# Patient Record
Sex: Female | Born: 1990 | Race: Black or African American | Hispanic: No | Marital: Single | Smoking: Never smoker
Health system: Southern US, Community
[De-identification: ages and names within clinical notes are randomized; demographics above are authoritative.]

## PROBLEM LIST (undated history)

## (undated) ENCOUNTER — Inpatient Hospital Stay (HOSPITAL_COMMUNITY): Payer: Self-pay

## (undated) DIAGNOSIS — Z789 Other specified health status: Secondary | ICD-10-CM

## (undated) HISTORY — PX: APPENDECTOMY: SHX54

---

## 2011-04-06 ENCOUNTER — Inpatient Hospital Stay (HOSPITAL_COMMUNITY): Admission: AD | Admit: 2011-04-06 | Payer: Self-pay | Source: Home / Self Care | Admitting: Obstetrics and Gynecology

## 2015-09-26 ENCOUNTER — Encounter (HOSPITAL_COMMUNITY): Payer: Self-pay | Admitting: *Deleted

## 2015-09-26 ENCOUNTER — Inpatient Hospital Stay (HOSPITAL_COMMUNITY)
Admission: AD | Admit: 2015-09-26 | Discharge: 2015-09-26 | Disposition: A | Discharge status: Home / Self Care | Payer: BLUE CROSS/BLUE SHIELD | Source: Ambulatory Visit | Attending: Obstetrics and Gynecology | Admitting: Obstetrics and Gynecology

## 2015-09-26 DIAGNOSIS — O3680X0 Pregnancy with inconclusive fetal viability, not applicable or unspecified: Secondary | ICD-10-CM

## 2015-09-26 DIAGNOSIS — O26891 Other specified pregnancy related conditions, first trimester: Secondary | ICD-10-CM | POA: Insufficient documentation

## 2015-09-26 DIAGNOSIS — Z3A01 Less than 8 weeks gestation of pregnancy: Secondary | ICD-10-CM | POA: Insufficient documentation

## 2015-09-26 DIAGNOSIS — R109 Unspecified abdominal pain: Secondary | ICD-10-CM | POA: Insufficient documentation

## 2015-09-26 DIAGNOSIS — O9989 Other specified diseases and conditions complicating pregnancy, childbirth and the puerperium: Secondary | ICD-10-CM

## 2015-09-26 DIAGNOSIS — O26899 Other specified pregnancy related conditions, unspecified trimester: Secondary | ICD-10-CM

## 2015-09-26 HISTORY — DX: Other specified health status: Z78.9

## 2015-09-26 LAB — CBC
HEMATOCRIT: 36.8 % (ref 36.0–46.0)
HEMOGLOBIN: 11.6 g/dL — AB (ref 12.0–15.0)
MCH: 27.5 pg (ref 26.0–34.0)
MCHC: 31.5 g/dL (ref 30.0–36.0)
MCV: 87.2 fL (ref 78.0–100.0)
Platelets: 229 10*3/uL (ref 150–400)
RBC: 4.22 MIL/uL (ref 3.87–5.11)
RDW: 12.5 % (ref 11.5–15.5)
WBC: 8.7 10*3/uL (ref 4.0–10.5)

## 2015-09-26 LAB — POCT PREGNANCY, URINE: Preg Test, Ur: POSITIVE — AB

## 2015-09-26 LAB — URINE MICROSCOPIC-ADD ON

## 2015-09-26 LAB — URINALYSIS, ROUTINE W REFLEX MICROSCOPIC
BILIRUBIN URINE: NEGATIVE
GLUCOSE, UA: NEGATIVE mg/dL
KETONES UR: 15 mg/dL — AB
LEUKOCYTES UA: NEGATIVE
NITRITE: NEGATIVE
PH: 6 (ref 5.0–8.0)
PROTEIN: NEGATIVE mg/dL
Specific Gravity, Urine: 1.02 (ref 1.005–1.030)
Urobilinogen, UA: 2 mg/dL — ABNORMAL HIGH (ref 0.0–1.0)

## 2015-09-26 LAB — HCG, QUANTITATIVE, PREGNANCY: HCG, BETA CHAIN, QUANT, S: 636 m[IU]/mL — AB (ref <5)

## 2015-09-26 NOTE — MAU Note (Signed)
Pt stated she started having lower abd pain and back pain today. Had a positive HPT.

## 2015-09-26 NOTE — MAU Provider Note (Signed)
History     CSN: [id number]  Arrival date and time: 09/26/15 1718   None     Chief Complaint  Patient presents with  . Abdominal Pain  . Back Pain   HPI   Theresa Hull is a 24 y.o. female G1P0 @ 1649w1d presenting for an US.  She took an at home pregnancy test today and it was positive. The Abdominal pain started today shortly after the test was positive; bilateral lower abdomen, + cramping pain. Currently rates her pain 5/10; the pain comes and goes.   Denies vaginal bleeding.   OB History    Gravida Para Term Preterm AB TAB SAB Ectopic Multiple Living   1               No past medical history on file.  No past surgical history on file.  No family history on file.  Social History  Substance Use Topics  . Smoking status: Not on file  . Smokeless tobacco: Not on file  . Alcohol Use: Not on file    Allergies: Allergies not on file  No prescriptions prior to admission   Results for orders placed or performed during the hospital encounter of 09/26/15 (from the past 48 hour(s))  Urinalysis, Routine w reflex microscopic ([facility])     Status: Abnormal   Collection Time: 09/26/15  5:40 PM  Result Value Ref Range   Color, Urine YELLOW YELLOW   APPearance CLEAR CLEAR   Specific Gravity, Urine 1.020 1.005 - 1.030   pH 6.0 5.0 - 8.0   Glucose, UA NEGATIVE NEGATIVE mg/dL   Hgb urine dipstick TRACE (A) NEGATIVE   Bilirubin Urine NEGATIVE NEGATIVE   Ketones, ur 15 (A) NEGATIVE mg/dL   Protein, ur NEGATIVE NEGATIVE mg/dL   Urobilinogen, UA 2.0 (H) 0.0 - 1.0 mg/dL   Nitrite NEGATIVE NEGATIVE   Leukocytes, UA NEGATIVE NEGATIVE  Urine microscopic-add on     Status: None   Collection Time: 09/26/15  5:40 PM  Result Value Ref Range   Squamous Epithelial / LPF RARE RARE   WBC, UA 0-2 <3 WBC/hpf   RBC / HPF 0-2 <3 RBC/hpf   Bacteria, UA RARE RARE  Pregnancy, urine POC     Status: Abnormal   Collection Time: 09/26/15  5:46 PM  Result Value Ref Range   Preg  Test, Ur POSITIVE (A) NEGATIVE    Comment:        THE SENSITIVITY OF THIS METHODOLOGY IS >24 mIU/mL   hCG, quantitative, pregnancy     Status: Abnormal   Collection Time: 09/26/15  7:01 PM  Result Value Ref Range   hCG, Beta Chain, Quant, S 636 (H) <5 mIU/mL    Comment:          GEST. AGE      CONC.  (mIU/mL)   <=1 WEEK        5 - 50     2 WEEKS       50 - 500     3 WEEKS       100 - 10,000     4 WEEKS     1,000 - 30,000     5 WEEKS     3,500 - 115,000   6-8 WEEKS     12,000 - 270,000    12 WEEKS     15,000 - 220,000        FEMALE AND NON-PREGNANT FEMALE:     LESS THAN 5 mIU/mL  CBC     Status: Abnormal   Collection Time: 09/26/15  7:01 PM  Result Value Ref Range   WBC 8.7 4.0 - 10.5 K/uL   RBC 4.22 3.87 - 5.11 MIL/uL   Hemoglobin 11.6 (L) 12.0 - 15.0 g/dL   HCT 16.1 09.6 - 04.5 %   MCV 87.2 78.0 - 100.0 fL   MCH 27.5 26.0 - 34.0 pg   MCHC 31.5 30.0 - 36.0 g/dL   RDW 40.9 81.1 - 91.4 %   Platelets 229 150 - 400 K/uL     Review of Systems  Constitutional: Negative for fever and chills.  Gastrointestinal: Positive for abdominal pain. Negative for nausea and vomiting.  Neurological: Negative for headaches.   Physical Exam   Blood pressure 128/83, pulse 106, temperature 98.8 F (37.1 C), temperature source Oral, resp. rate 18, height  (1.626 m), weight 207 lb 6.4 oz (94.076 kg), last menstrual period 08/20/2015.  Physical Exam  Constitutional: She is oriented to person, place, and time. She appears well-developed and well-nourished.  Non-toxic appearance. She does not have a sickly appearance. She does not appear ill. No distress.  HENT:  Head: Normocephalic.  Eyes: Pupils are equal, round, and reactive to light.  Neck: Neck supple.  Respiratory: Effort normal.  GI: Soft. She exhibits no distension. There is no tenderness. There is no rebound.  Musculoskeletal: Normal range of motion.  Neurological: She is alert and oriented to person, place, and time.  Skin:  Skin is warm. She is not diaphoretic.  Psychiatric: Her behavior is normal.    MAU Course  Procedures  None  MDM  Discussed patient with Dr. Marcelle Overlie. No [place] needed at this time> will ask patient to return in 48 hours for quant CBC   Assessment and Plan   A:  1. Abdominal pain in pregnancy   2. Pregnancy of unknown anatomic location    P:  Discharge home in stable condition Follow up in MAU in 48 hours for repeat beta hcg level Pelvic rest Ectopic precautions Return to MAU if symptoms worsen   Duane Lope, NP 09/26/2015 10:19 PM

## 2015-09-26 NOTE — MAU Note (Signed)
J Rasch NP in Triage to talk with pt lab results and d/c plan. Will return in 48hrs for labs. D/c from Triage

## 2015-10-23 LAB — OB RESULTS CONSOLE ABO/RH: RH Type: POSITIVE

## 2015-10-23 LAB — OB RESULTS CONSOLE ANTIBODY SCREEN: ANTIBODY SCREEN: NEGATIVE

## 2015-10-23 LAB — OB RESULTS CONSOLE RPR: RPR: NONREACTIVE

## 2015-10-23 LAB — OB RESULTS CONSOLE RUBELLA ANTIBODY, IGM: Rubella: IMMUNE

## 2015-10-23 LAB — OB RESULTS CONSOLE HEPATITIS B SURFACE ANTIGEN: Hepatitis B Surface Ag: NEGATIVE

## 2015-10-23 LAB — OB RESULTS CONSOLE HIV ANTIBODY (ROUTINE TESTING): HIV: NONREACTIVE

## 2015-11-10 LAB — OB RESULTS CONSOLE GC/CHLAMYDIA
CHLAMYDIA, DNA PROBE: NEGATIVE
GC PROBE AMP, GENITAL: NEGATIVE

## 2016-05-09 LAB — OB RESULTS CONSOLE GBS: GBS: POSITIVE

## 2016-05-16 ENCOUNTER — Inpatient Hospital Stay (HOSPITAL_COMMUNITY)
Admission: AD | Admit: 2016-05-16 | Discharge: 2016-05-16 | Disposition: A | Discharge status: Home / Self Care | Payer: BLUE CROSS/BLUE SHIELD | Source: Ambulatory Visit | Attending: Obstetrics and Gynecology | Admitting: Obstetrics and Gynecology

## 2016-05-16 ENCOUNTER — Encounter (HOSPITAL_COMMUNITY): Payer: Self-pay

## 2016-05-16 DIAGNOSIS — Z3493 Encounter for supervision of normal pregnancy, unspecified, third trimester: Secondary | ICD-10-CM | POA: Insufficient documentation

## 2016-05-16 MED ORDER — LACTATED RINGERS IV BOLUS (SEPSIS)
1000.0000 mL | Freq: Once | INTRAVENOUS | Status: AC
  Administered 2016-05-16: 1000 mL via INTRAVENOUS

## 2016-05-16 NOTE — MAU Note (Signed)
Pt c/o feeling contractions since Wednesday. The contractions vary in timing between 5 to 20 minutes apart. Pt denies bleeding and leaking of fluid. Pt states baby has been moving normally.

## 2016-05-21 ENCOUNTER — Encounter (HOSPITAL_COMMUNITY): Payer: Self-pay | Admitting: *Deleted

## 2016-05-21 ENCOUNTER — Inpatient Hospital Stay (HOSPITAL_COMMUNITY): Payer: BLUE CROSS/BLUE SHIELD

## 2016-05-21 ENCOUNTER — Inpatient Hospital Stay (HOSPITAL_COMMUNITY)
Admission: AD | Admit: 2016-05-21 | Discharge: 2016-05-22 | Disposition: A | Discharge status: Home / Self Care | Payer: BLUE CROSS/BLUE SHIELD | Source: Ambulatory Visit | Attending: Obstetrics & Gynecology | Admitting: Obstetrics & Gynecology

## 2016-05-21 DIAGNOSIS — Z3A38 38 weeks gestation of pregnancy: Secondary | ICD-10-CM | POA: Diagnosis not present

## 2016-05-21 DIAGNOSIS — IMO0002 Reserved for concepts with insufficient information to code with codable children: Secondary | ICD-10-CM

## 2016-05-21 DIAGNOSIS — O283 Abnormal ultrasonic finding on antenatal screening of mother: Secondary | ICD-10-CM | POA: Diagnosis not present

## 2016-05-21 DIAGNOSIS — O368390 Maternal care for abnormalities of the fetal heart rate or rhythm, unspecified trimester, not applicable or unspecified: Secondary | ICD-10-CM

## 2016-05-21 IMAGING — US US MFM FETAL BPP W/O NON-STRESS
1 series · 13 of 15 positions shown · non-contrast
Comparison: none

[Series 1: us mfm fetal bpp w/o non-stress · 15 acquisitions, 13 frames shown]
[im 1/15]
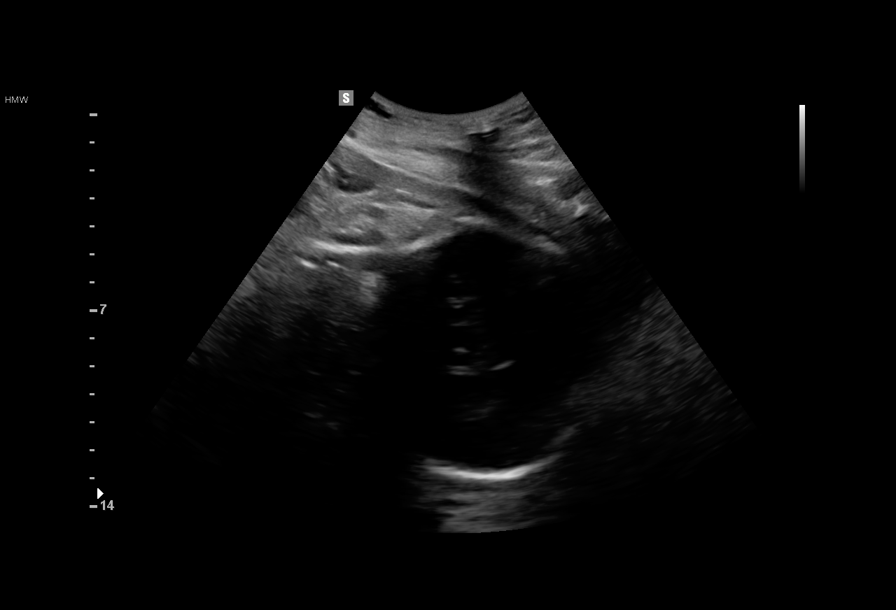
[im 2/15]
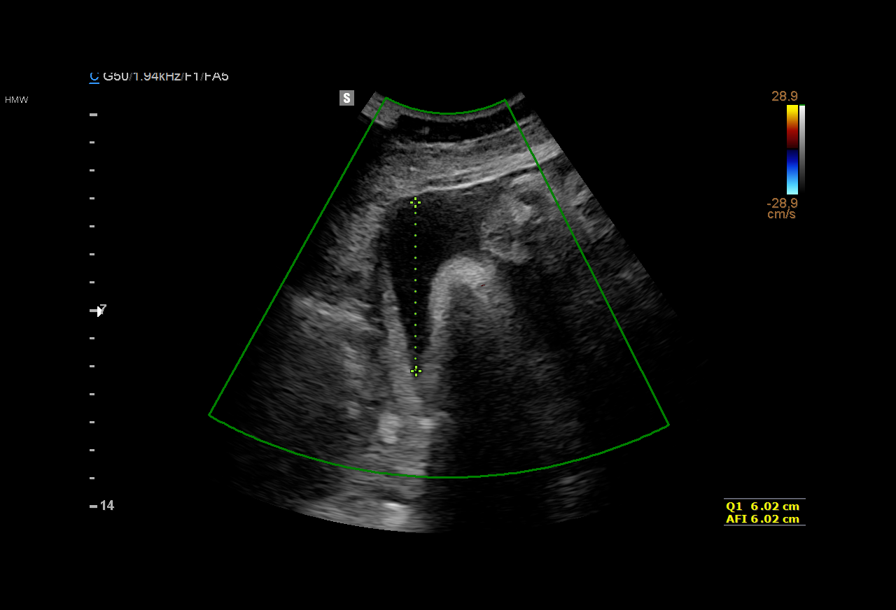
[im 3/15]
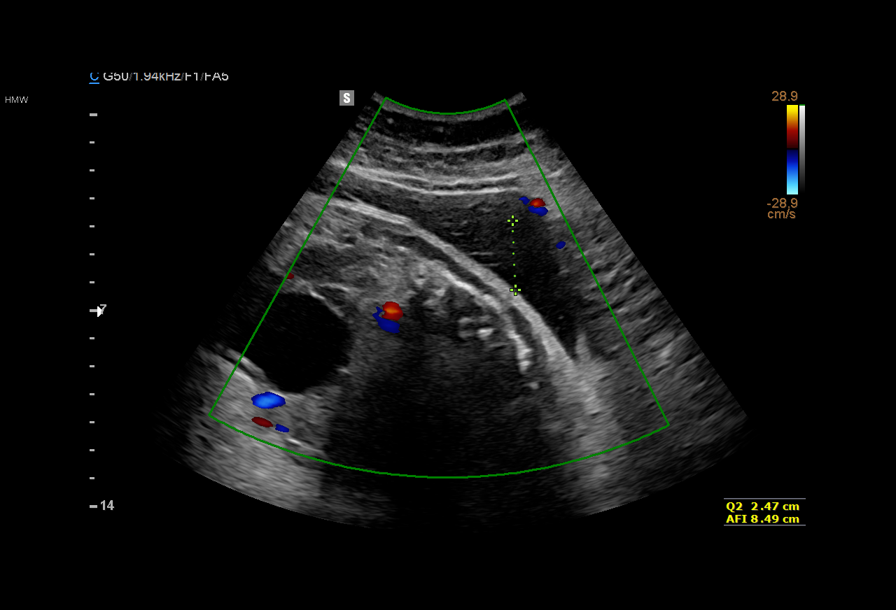
[im 5/15]
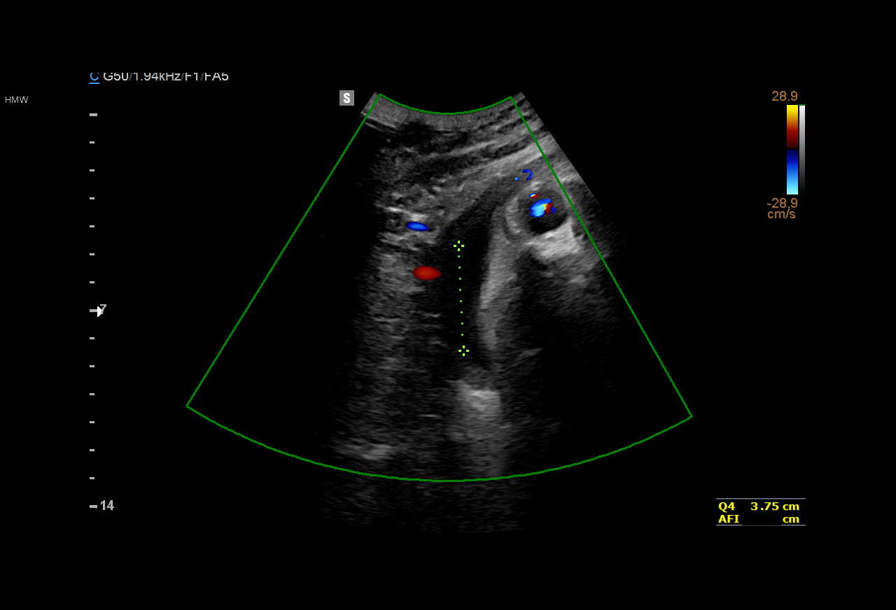
[im 6/15]
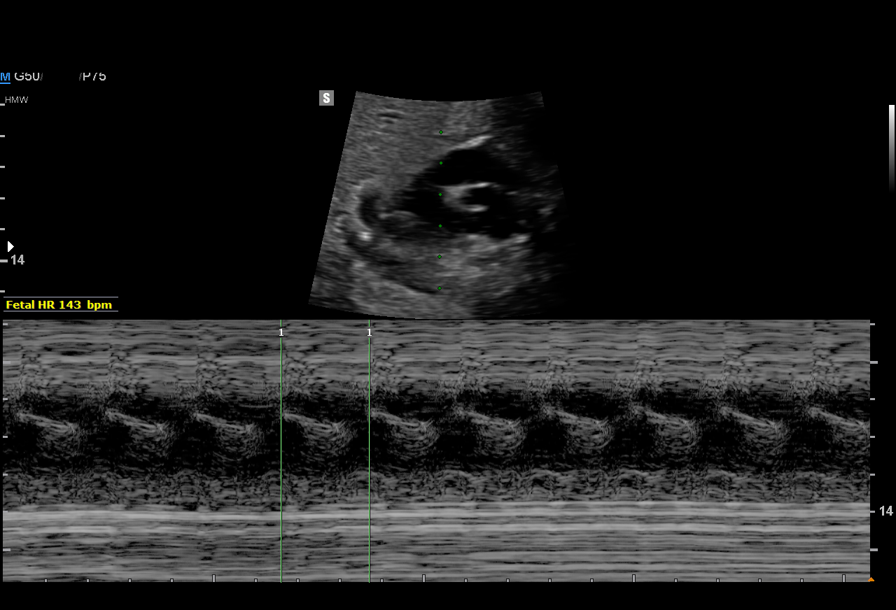
[im 7/15]
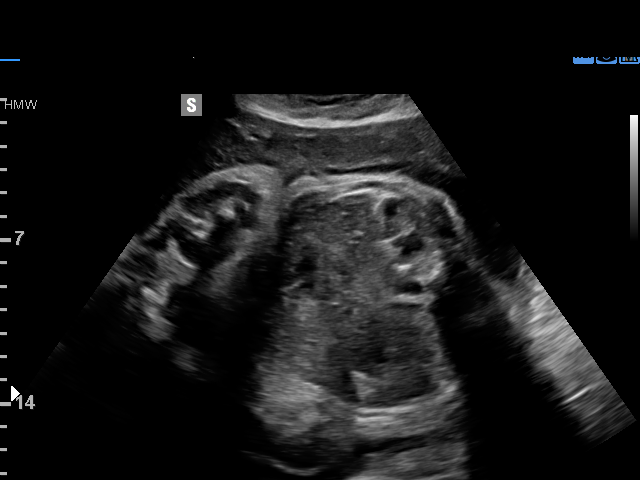
[im 8/15]
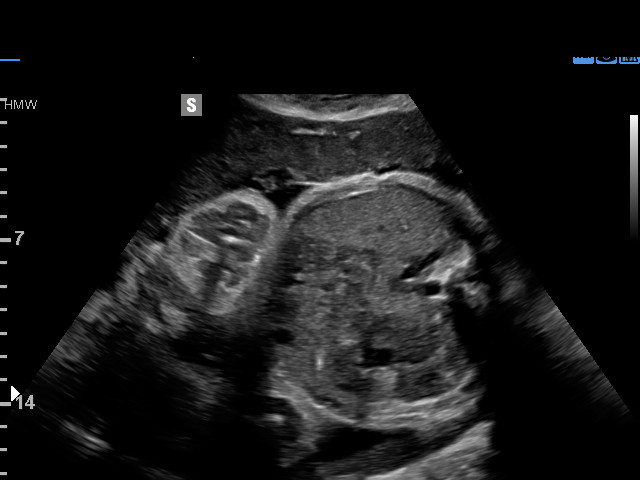
[im 9/15]
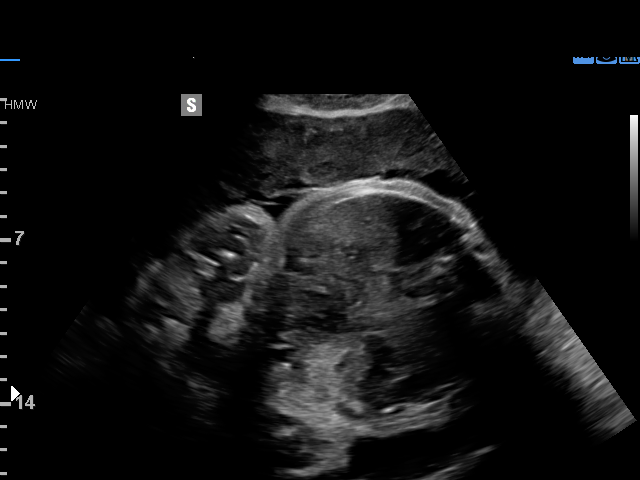
[im 10/15]
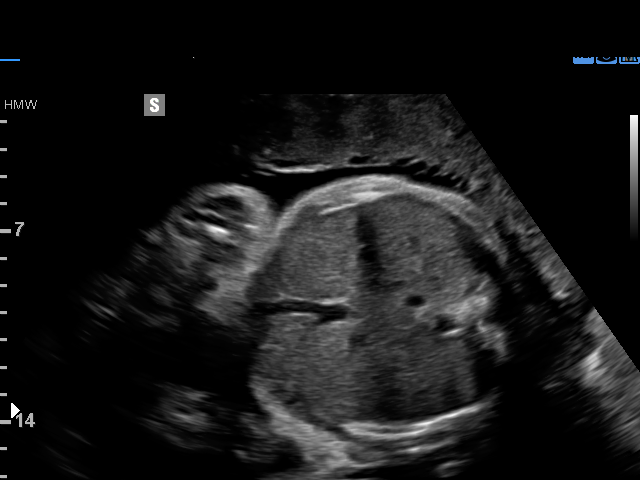
[im 11/15]
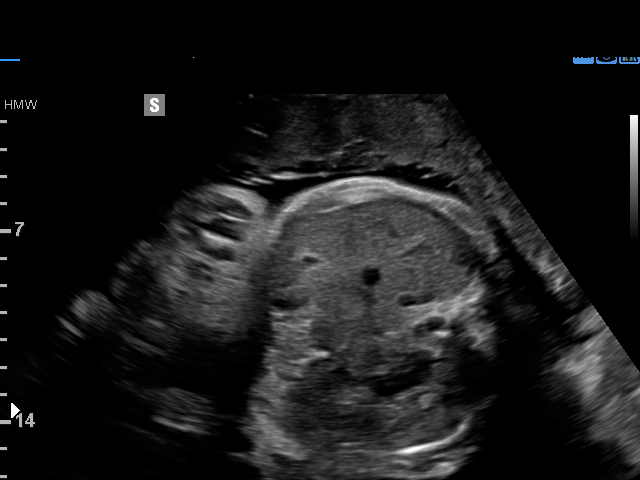
[im 13/15]
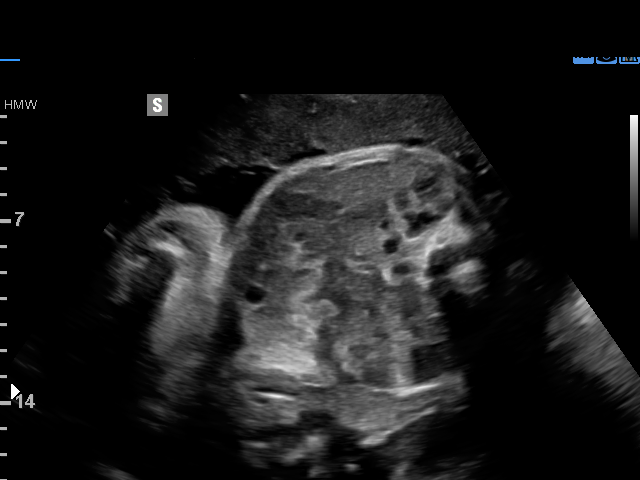
[im 14/15]
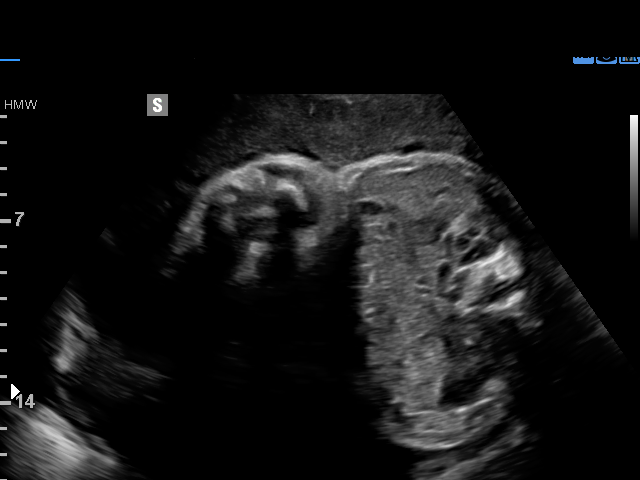
[im 15/15]
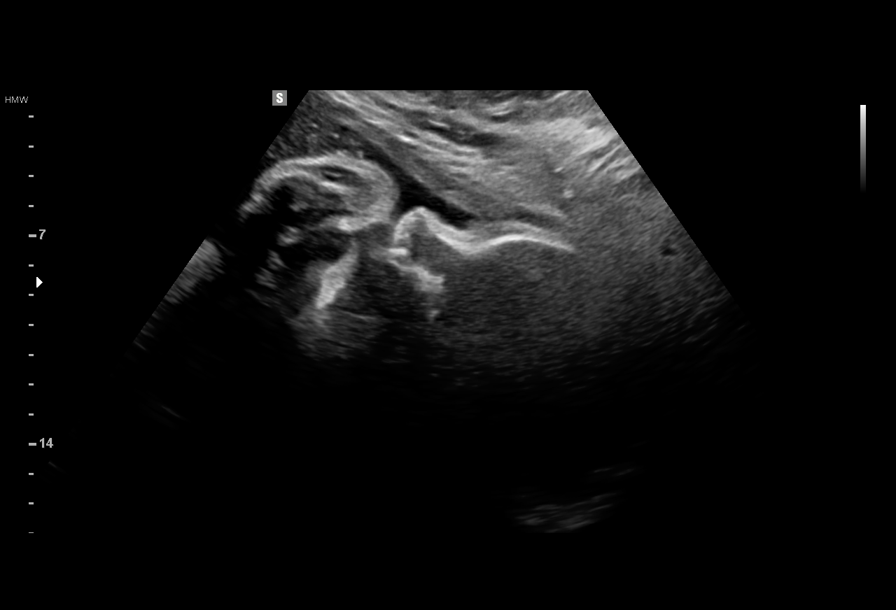

[13 of 15 positions shown; findings below may reference images not displayed]

[REDACTED]. [HOSPITAL]
Attending:        TURGEON        Secondary Phy.:    TURGEON Nursing-
MAU/Triage
DO

1  TURGEON             [PHONE_NUMBER]      [PHONE_NUMBER]     [PHONE_NUMBER]
Indications

38 weeks gestation of pregnancy
Non-reactive NST, FHR decelerations            [MW]
OB History

Gravidity:    2         Term:   1
Living:       1
Fetal Evaluation

Num Of Fetuses:     1
Fetal Heart         143
Rate(bpm):
Cardiac Activity:   Observed
Presentation:       Cephalic

Amniotic Fluid
AFI FV:      Subjectively within normal limits

AFI Sum(cm)     %Tile       Largest Pocket(cm)
15.37           60

RUQ(cm)       RLQ(cm)       LUQ(cm)        LLQ(cm)
6.02
Biophysical Evaluation

Amniotic F.V:   Within normal limits       F. Tone:         Observed
F. Movement:    Observed                   Score:           [DATE]
F. Breathing:   Observed
Gestational Age
LMP:           38w 1d        Date:  [DATE]                 EDD:   [DATE]
Best:          38w 1d     Det. By:  LMP  ([DATE])          EDD:   [DATE]
Cervix Uterus Adnexa

Cervix
Not visualized (advanced GA >[MW])
Impression

Single IUP at 38w 1d
BPP [DATE]
Cephalic presentation
Normal amniotic fluid volume
Recommendations

Follow up ultrasound as clinically indicated

## 2016-05-21 NOTE — MAU Note (Signed)
Patient has been having irregular contractions for past few days and they are now 15-7225min apart.  Denies LOF or vaginal bleeding.  Reports +fetal movement.

## 2016-05-22 DIAGNOSIS — O283 Abnormal ultrasonic finding on antenatal screening of mother: Secondary | ICD-10-CM | POA: Diagnosis not present

## 2016-05-26 ENCOUNTER — Inpatient Hospital Stay (HOSPITAL_COMMUNITY)
Admission: AD | Admit: 2016-05-26 | Discharge: 2016-05-28 | DRG: 775 | Disposition: A | Discharge status: Home / Self Care | Payer: BLUE CROSS/BLUE SHIELD | Source: Ambulatory Visit | Attending: Obstetrics and Gynecology | Admitting: Obstetrics and Gynecology

## 2016-05-26 ENCOUNTER — Encounter (HOSPITAL_COMMUNITY): Payer: Self-pay

## 2016-05-26 ENCOUNTER — Inpatient Hospital Stay (HOSPITAL_COMMUNITY)
Admission: AD | Admit: 2016-05-26 | Discharge: 2016-05-26 | Disposition: A | Discharge status: Home / Self Care | Payer: BLUE CROSS/BLUE SHIELD | Source: Ambulatory Visit | Attending: Obstetrics and Gynecology | Admitting: Obstetrics and Gynecology

## 2016-05-26 ENCOUNTER — Encounter (HOSPITAL_COMMUNITY): Payer: Self-pay | Admitting: *Deleted

## 2016-05-26 DIAGNOSIS — Z8249 Family history of ischemic heart disease and other diseases of the circulatory system: Secondary | ICD-10-CM

## 2016-05-26 DIAGNOSIS — IMO0001 Reserved for inherently not codable concepts without codable children: Secondary | ICD-10-CM

## 2016-05-26 DIAGNOSIS — Z833 Family history of diabetes mellitus: Secondary | ICD-10-CM

## 2016-05-26 DIAGNOSIS — O99824 Streptococcus B carrier state complicating childbirth: Secondary | ICD-10-CM | POA: Diagnosis present

## 2016-05-26 DIAGNOSIS — Z3A38 38 weeks gestation of pregnancy: Secondary | ICD-10-CM

## 2016-05-26 LAB — URINALYSIS, ROUTINE W REFLEX MICROSCOPIC
BILIRUBIN URINE: NEGATIVE
GLUCOSE, UA: NEGATIVE mg/dL
HGB URINE DIPSTICK: NEGATIVE
KETONES UR: 15 mg/dL — AB
Leukocytes, UA: NEGATIVE
Nitrite: NEGATIVE
PROTEIN: NEGATIVE mg/dL
Specific Gravity, Urine: 1.01 (ref 1.005–1.030)
pH: 7 (ref 5.0–8.0)

## 2016-05-26 LAB — CBC
HCT: 36.8 % (ref 36.0–46.0)
Hemoglobin: 11.9 g/dL — ABNORMAL LOW (ref 12.0–15.0)
MCH: 27.4 pg (ref 26.0–34.0)
MCHC: 32.3 g/dL (ref 30.0–36.0)
MCV: 84.6 fL (ref 78.0–100.0)
PLATELETS: 271 10*3/uL (ref 150–400)
RBC: 4.35 MIL/uL (ref 3.87–5.11)
RDW: 13.1 % (ref 11.5–15.5)
WBC: 12.8 10*3/uL — ABNORMAL HIGH (ref 4.0–10.5)

## 2016-05-26 MED ORDER — FLEET ENEMA 7-19 GM/118ML RE ENEM: 1.0000 | ENEMA | RECTAL | Status: DC | PRN

## 2016-05-26 MED ORDER — OXYCODONE-ACETAMINOPHEN 5-325 MG PO TABS: 1.0000 | ORAL_TABLET | ORAL | Status: DC | PRN

## 2016-05-26 MED ORDER — LIDOCAINE HCL (PF) 1 % IJ SOLN
INTRAMUSCULAR | Status: AC
  Filled 2016-05-26: qty 30

## 2016-05-26 MED ORDER — OXYTOCIN 40 UNITS IN LACTATED RINGERS INFUSION - SIMPLE MED: 2.5000 [IU]/h | INTRAVENOUS | Status: DC

## 2016-05-26 MED ORDER — ACETAMINOPHEN 325 MG PO TABS: 650.0000 mg | ORAL_TABLET | ORAL | Status: DC | PRN

## 2016-05-26 MED ORDER — LACTATED RINGERS IV SOLN: 500.0000 mL | INTRAVENOUS | Status: DC | PRN

## 2016-05-26 MED ORDER — ONDANSETRON HCL 4 MG/2ML IJ SOLN: 4.0000 mg | Freq: Four times a day (QID) | INTRAMUSCULAR | Status: DC | PRN

## 2016-05-26 MED ORDER — SOD CITRATE-CITRIC ACID 500-334 MG/5ML PO SOLN: 30.0000 mL | ORAL | Status: DC | PRN

## 2016-05-26 MED ORDER — AMPICILLIN SODIUM 2 G IJ SOLR
2.0000 g | Freq: Once | INTRAMUSCULAR | Status: AC
  Administered 2016-05-26: 2 g via INTRAVENOUS
  Filled 2016-05-26: qty 2000

## 2016-05-26 MED ORDER — OXYTOCIN 40 UNITS IN LACTATED RINGERS INFUSION - SIMPLE MED
INTRAVENOUS | Status: AC
  Filled 2016-05-26: qty 1000

## 2016-05-26 MED ORDER — OXYTOCIN BOLUS FROM INFUSION: 500.0000 mL | INTRAVENOUS | Status: DC

## 2016-05-26 MED ORDER — LIDOCAINE HCL (PF) 1 % IJ SOLN
30.0000 mL | INTRAMUSCULAR | Status: DC | PRN
  Filled 2016-05-26: qty 30

## 2016-05-26 MED ORDER — OXYCODONE-ACETAMINOPHEN 5-325 MG PO TABS
2.0000 | ORAL_TABLET | ORAL | Status: DC | PRN
Start: 2016-05-26 — End: 2016-05-27

## 2016-05-26 MED ORDER — LACTATED RINGERS IV SOLN
INTRAVENOUS | Status: DC
  Administered 2016-05-26: 23:00:00 via INTRAVENOUS

## 2016-05-26 NOTE — H&P (Signed)
Theresa Hull is a 25 y.o. female G2P1 @ 38+5 wks presenting for SOL.    History OB History    Gravida Para Term Preterm AB TAB SAB Ectopic Multiple Living   2 1 1       1      Past Medical History  Diagnosis Date  . Medical history non-contributory    Past Surgical History  Procedure Laterality Date  . Appendectomy     Family History: family history includes Diabetes in her maternal grandmother; Hypertension in her maternal grandmother and mother. Social History:  reports that she has never smoked. She has never used smokeless tobacco. She reports that she does not drink alcohol or use illicit drugs.   Prenatal Transfer Tool  Maternal Diabetes: No Genetic Screening: Declined Maternal Ultrasounds/Referrals: Normal Fetal Ultrasounds or other Referrals:  None Maternal Substance Abuse:  No Significant Maternal Medications:  None Significant Maternal Lab Results:  None Other Comments:  None  ROS  Dilation: 8 Effacement (%): 80 Station: 0 Exam by:: Diamond NickelAleta Harper RNC Blood pressure 118/73, pulse 107, temperature 98.1 F (36.7 C), temperature source Oral, resp. rate 22, height 5\' 4"  (1.626 m), weight 223 lb (101.152 kg), last menstrual period 08/28/2015. Exam Physical Exam  Gen - uncomfortable w/ ctx Abd - gravid Ext - NT Cvx 7-8cm on admission Prenatal labs: ABO, Rh: O/Positive/-- (11/17 0000) Antibody: Negative (11/17 0000) Rubella: Immune (11/17 0000) RPR: Nonreactive (11/17 0000)  HBsAg: Negative (11/17 0000)  HIV: Non-reactive (11/17 0000)  GBS: Positive (06/04 0000)   Assessment/Plan: Admit   Theresa Hull 05/26/2016, 11:48 PM

## 2016-05-26 NOTE — Discharge Instructions (Signed)
Braxton Hicks Contractions °Contractions of the uterus can occur throughout pregnancy. Contractions are not always a sign that you are in labor.  °WHAT ARE BRAXTON HICKS CONTRACTIONS?  °Contractions that occur before labor are called Braxton Hicks contractions, or false labor. Toward the end of pregnancy (32-34 weeks), these contractions can develop more often and may become more forceful. This is not true labor because these contractions do not result in opening (dilatation) and thinning of the cervix. They are sometimes difficult to tell apart from true labor because these contractions can be forceful and people have different pain tolerances. You should not feel embarrassed if you go to the hospital with false labor. Sometimes, the only way to tell if you are in true labor is for your health care provider to look for changes in the cervix. °If there are no prenatal problems or other health problems associated with the pregnancy, it is completely safe to be sent home with false labor and await the onset of true labor. °HOW CAN YOU TELL THE DIFFERENCE BETWEEN TRUE AND FALSE LABOR? °False Labor °· The contractions of false labor are usually shorter and not as hard as those of true labor.   °· The contractions are usually irregular.   °· The contractions are often felt in the front of the lower abdomen and in the groin.   °· The contractions may go away when you walk around or change positions while lying down.   °· The contractions get weaker and are shorter lasting as time goes on.   °· The contractions do not usually become progressively stronger, regular, and closer together as with true labor.   °True Labor °· Contractions in true labor last 30-70 seconds, become very regular, usually become more intense, and increase in frequency.   °· The contractions do not go away with walking.   °· The discomfort is usually felt in the top of the uterus and spreads to the lower abdomen and low back.   °· True labor can be  determined by your health care provider with an exam. This will show that the cervix is dilating and getting thinner.   °WHAT TO REMEMBER °· Keep up with your usual exercises and follow other instructions given by your health care provider.   °· Take medicines as directed by your health care provider.   °· Keep your regular prenatal appointments.   °· Eat and drink lightly if you think you are going into labor.   °· If Braxton Hicks contractions are making you uncomfortable:   °¨ Change your position from lying down or resting to walking, or from walking to resting.   °¨ Sit and rest in a tub of warm water.   °¨ Drink 2-3 glasses of water. Dehydration may cause these contractions.   °¨ Do slow and deep breathing several times an hour.   °WHEN SHOULD I SEEK IMMEDIATE MEDICAL CARE? °Seek immediate medical care if: °· Your contractions become stronger, more regular, and closer together.   °· You have fluid leaking or gushing from your vagina.   °· You have a fever.   °· You pass blood-tinged mucus.   °· You have vaginal bleeding.   °· You have continuous abdominal pain.   °· You have low back pain that you never had before.   °· You feel your baby's head pushing down and causing pelvic pressure.   °· Your baby is not moving as much as it used to.   °  °This information is not intended to replace advice given to you by your health care provider. Make sure you discuss any questions you have with your health care   provider. °  °Document Released: 11/22/2005 Document Revised: 11/27/2013 Document Reviewed: 09/03/2013 °Elsevier Interactive Patient Education ©2016 [employer] ° °

## 2016-05-26 NOTE — MAU Note (Signed)
C/o ucs since this Am about 0800; denies SROM; denies any vaginal bleeding;

## 2016-05-26 NOTE — Progress Notes (Signed)
Pt precipitously delivered upon arrival to LDR Upon my arrival to room, head was delivered.  Loose nuchal cord x 1 delivered over shoulder Cord clamped and cut and infant placed on mom's chest. Placenta delivered spontaneous No lacerations, fundus firm Mom and baby doing well

## 2016-05-26 NOTE — MAU Note (Signed)
Pt here with contractions, was 2 cm earlier

## 2016-05-27 ENCOUNTER — Encounter (HOSPITAL_COMMUNITY): Payer: Self-pay

## 2016-05-27 LAB — CBC
HCT: 31.8 % — ABNORMAL LOW (ref 36.0–46.0)
HEMOGLOBIN: 10.3 g/dL — AB (ref 12.0–15.0)
MCH: 27.1 pg (ref 26.0–34.0)
MCHC: 32.4 g/dL (ref 30.0–36.0)
MCV: 83.7 fL (ref 78.0–100.0)
PLATELETS: 256 10*3/uL (ref 150–400)
RBC: 3.8 MIL/uL — AB (ref 3.87–5.11)
RDW: 13.2 % (ref 11.5–15.5)
WBC: 13.9 10*3/uL — ABNORMAL HIGH (ref 4.0–10.5)

## 2016-05-27 LAB — TYPE AND SCREEN
ABO/RH(D): O POS
Antibody Screen: NEGATIVE

## 2016-05-27 LAB — RPR: RPR: NONREACTIVE

## 2016-05-27 LAB — ABO/RH: ABO/RH(D): O POS

## 2016-05-27 MED ORDER — OXYCODONE-ACETAMINOPHEN 5-325 MG PO TABS: 1.0000 | ORAL_TABLET | ORAL | Status: DC | PRN

## 2016-05-27 MED ORDER — ONDANSETRON HCL 4 MG/2ML IJ SOLN: 4.0000 mg | INTRAMUSCULAR | Status: DC | PRN

## 2016-05-27 MED ORDER — SIMETHICONE 80 MG PO CHEW: 80.0000 mg | CHEWABLE_TABLET | ORAL | Status: DC | PRN

## 2016-05-27 MED ORDER — OXYCODONE-ACETAMINOPHEN 5-325 MG PO TABS: 2.0000 | ORAL_TABLET | ORAL | Status: DC | PRN

## 2016-05-27 MED ORDER — ACETAMINOPHEN 325 MG PO TABS: 650.0000 mg | ORAL_TABLET | ORAL | Status: DC | PRN

## 2016-05-27 MED ORDER — PRENATAL MULTIVITAMIN CH
1.0000 | ORAL_TABLET | Freq: Every day | ORAL | Status: DC
  Administered 2016-05-27 – 2016-05-28 (×2): 1 via ORAL
  Filled 2016-05-27 (×2): qty 1

## 2016-05-27 MED ORDER — DIPHENHYDRAMINE HCL 25 MG PO CAPS: 25.0000 mg | ORAL_CAPSULE | Freq: Four times a day (QID) | ORAL | Status: DC | PRN

## 2016-05-27 MED ORDER — DIBUCAINE 1 % RE OINT: 1.0000 "application " | TOPICAL_OINTMENT | RECTAL | Status: DC | PRN

## 2016-05-27 MED ORDER — TETANUS-DIPHTH-ACELL PERTUSSIS 5-2.5-18.5 LF-MCG/0.5 IM SUSP: 0.5000 mL | Freq: Once | INTRAMUSCULAR | Status: DC

## 2016-05-27 MED ORDER — SENNOSIDES-DOCUSATE SODIUM 8.6-50 MG PO TABS
2.0000 | ORAL_TABLET | ORAL | Status: DC
  Administered 2016-05-27: 2 via ORAL
  Filled 2016-05-27: qty 2

## 2016-05-27 MED ORDER — MEASLES, MUMPS & RUBELLA VAC ~~LOC~~ INJ
0.5000 mL | INJECTION | Freq: Once | SUBCUTANEOUS | Status: DC
  Filled 2016-05-27: qty 0.5

## 2016-05-27 MED ORDER — WITCH HAZEL-GLYCERIN EX PADS: 1.0000 "application " | MEDICATED_PAD | CUTANEOUS | Status: DC | PRN

## 2016-05-27 MED ORDER — COCONUT OIL OIL
1.0000 "application " | TOPICAL_OIL | Status: DC | PRN
  Filled 2016-05-27: qty 120

## 2016-05-27 MED ORDER — MEDROXYPROGESTERONE ACETATE 150 MG/ML IM SUSP: 150.0000 mg | INTRAMUSCULAR | Status: DC | PRN

## 2016-05-27 MED ORDER — IBUPROFEN 600 MG PO TABS
600.0000 mg | ORAL_TABLET | Freq: Four times a day (QID) | ORAL | Status: DC
  Administered 2016-05-27 – 2016-05-28 (×6): 600 mg via ORAL
  Filled 2016-05-27 (×7): qty 1

## 2016-05-27 MED ORDER — ONDANSETRON HCL 4 MG PO TABS: 4.0000 mg | ORAL_TABLET | ORAL | Status: DC | PRN

## 2016-05-27 MED ORDER — BENZOCAINE-MENTHOL 20-0.5 % EX AERO: 1.0000 "application " | INHALATION_SPRAY | CUTANEOUS | Status: DC | PRN

## 2016-05-27 NOTE — Lactation Note (Signed)
This note was copied from a baby's chart. Lactation Consultation Note Experienced BF mom BF her 1st child now 806 yrs old for 6 weeks. Mom stated its coming all back to her and baby is doing great. Denies painful latches. States she has colostrum and baby has latch and BF well 3 times since birth. WH/LC brochure given w/resources, support groups and LC services. [facility] Mom is very sleepy, falling a sleep while talking. Unable to reviewed a lot of things. Encouraged to call nursing  Or LC for latch difficulty.  Patient Name: Theresa Hull ZOXWR'UToday's Date: 05/27/2016 Reason for consult: Initial assessment   Maternal Data Has patient been taught Hand Expression?: Yes Does the patient have breastfeeding experience prior to this delivery?: Yes  Feeding Feeding Type: Breast Fed Length of feed: 15 min  LATCH Score/Interventions Latch: Grasps breast easily, tongue down, lips flanged, rhythmical sucking.  Audible Swallowing: Spontaneous and intermittent  Type of Nipple: Everted at rest and after stimulation  Comfort (Breast/Nipple): Soft / non-tender     Hold (Positioning): No assistance needed to correctly position infant at breast.  LATCH Score: 10  Lactation Tools Discussed/Used WIC Program: Yes   Consult Status Consult Status: Follow-up Date: 05/28/16 Follow-up type: In-patient    Charyl DancerCARVER, Zaeden Lastinger G 05/27/2016, 2:49 AM

## 2016-05-27 NOTE — Lactation Note (Signed)
This note was copied from a baby's chart. Lactation Consultation Note  Patient Name: Theresa Kennith GainCourtney Durflinger RUEAV'WToday's Date: 05/27/2016 Reason for consult: Follow-up assessment Visited with mom, baby 4011 hrs old.  Mom states she is feeling shaky during breastfeeding which is concerning her.  Mom also feeling uterine contraction during feedings.  Reassured Mom that her hormones are shifting from delivery, and breastfeeding her baby, and having the shakes was not uncommon (Mom's temp normal).  Mom requested formula as she was uncomfortable with the trembles.  Baby rooting while skin to skin on Mom's chest.  Assisted her to latch baby in football hold.  Mom needing some guidance to facilitate a deep areolar grasp.  Lots of teaching done while baby feeding.  No shaking felt during feeding, only uterine cramping.  Encouraged her to exclusively breastfeed, and avoid formula this early.  To ask for help as needed, and LC to follow up in am.    Consult Status Consult Status: Follow-up Date: 05/28/16 Follow-up type: In-patient    Judee ClaraSmith, Rayel Santizo E 05/27/2016, 11:29 AM

## 2016-05-27 NOTE — Progress Notes (Signed)
Post Partum Day 1 Subjective: no complaints, up ad lib, voiding and tolerating PO  Objective: Blood pressure 113/64, pulse 76, temperature 98.1 F (36.7 C), temperature source Oral, resp. rate 18, height 5\' 4"  (1.626 m), weight 223 lb (101.152 kg), last menstrual period 08/28/2015, SpO2 99 %, unknown if currently breastfeeding.  Physical Exam:  General: alert and cooperative Lochia: appropriate Uterine Fundus: firm Incision: perineum intact DVT Evaluation: No evidence of DVT seen on physical exam. Negative Homan's sign. No cords or calf tenderness. No significant calf/ankle edema.   Recent Labs  05/26/16 2325 05/27/16 0539  HGB 11.9* 10.3*  HCT 36.8 31.8*    Assessment/Plan: Plan for discharge tomorrow   LOS: 1 day   CURTIS,CAROL G 05/27/2016, 8:11 AM

## 2016-05-28 MED ORDER — IBUPROFEN 600 MG PO TABS: 600.0000 mg | ORAL_TABLET | Freq: Four times a day (QID) | ORAL | Status: DC

## 2016-05-28 NOTE — Discharge Summary (Signed)
Obstetric Discharge Summary Reason for Admission: onset of labor Prenatal Procedures: ultrasound Intrapartum Procedures: spontaneous vaginal delivery Postpartum Procedures: none Complications-Operative and Postpartum: none HEMOGLOBIN  Date Value Ref Range Status  05/27/2016 10.3* 12.0 - 15.0 g/dL Final   HCT  Date Value Ref Range Status  05/27/2016 31.8* 36.0 - 46.0 % Final    Physical Exam:  General: alert and cooperative Lochia: appropriate Uterine Fundus: firm Incision: perineum intact DVT Evaluation: No evidence of DVT seen on physical exam. Negative Homan's sign. No cords or calf tenderness. No significant calf/ankle edema.  Discharge Diagnoses: Term Pregnancy-delivered  Discharge Information: Date: 05/28/2016 Activity: pelvic rest Diet: routine Medications: PNV and Ibuprofen Condition: stable Instructions: refer to practice specific booklet Discharge to: home   Newborn Data: Live born female  Birth Weight: 6 lb 10.5 oz (3020 g) APGAR: 9, 9  Baby continues under supervision for hyperbilirubinemia   Neesa Knapik G 05/28/2016, 8:03 AM

## 2016-05-28 NOTE — Lactation Note (Addendum)
This note was copied from a baby's chart. Lactation Consultation Note  Baby has not stooled in more than 24 hours.  Left LC phone number and suggest she call to observe next feeding. Reviewed engorgement care and monitoring voids/stools. Recommend mother make sure latch is deep and she massages/compresses during feeding.  Mother called to view latch. Baby latched with ease.  Had mother compress/massage breast to keep baby active at the breast.   Observed frequent swallowing. Discussed waking techniques and feeding STS. Reminded mother to be sure to breastfeed on both breasts per feeding.  Patient Name: Theresa Hull: 05/28/2016 Reason for consult: Follow-up assessment   Maternal Data    Feeding Feeding Type: Formula (mom reports has had total of 35cc overnight-not sure of time) Nipple Type: Slow - flow  LATCH Score/Interventions                      Lactation Tools Discussed/Used     Consult Status Consult Status: Complete    Hardie PulleyBerkelhammer, Ruth Boschen 05/28/2016, 8:35 AM

## 2016-05-29 ENCOUNTER — Ambulatory Visit: Payer: Self-pay

## 2016-05-29 NOTE — Lactation Note (Signed)
This note was copied from a baby's chart. Lactation Consultation Note  Patient Name: Theresa Hull's Date: 05/29/2016 Reason for consult: Follow-up assessment Baby is 2958 hours old and has been consistent at the breast .  Per moms milk is in both breast , not engorged.  Per mom left nipple sore , LC assessed with mom request and noted   no breakdown.  LC reviewed basics , and hand expressing and nom returned demo .  LC instructed on the use shells, hand pump, increased to #27 flange For when the milk comes in. Per mom plans to obtain a DEBP from insurance company.  [facility] , and has used it once .  LC recommended using her EBM 1st. Sore nipple and engorgement prevention and tx reviewed.  LC observed the baby already latched with depth , multiply swallows, increased with breast compressions.  Mother informed of post-discharge support and given phone number to the lactation department, including services for phone  call assistance; out-patient appointments; and breastfeeding support group. List of other breastfeeding resources in the community  given in the handout. Encouraged mother to call for problems or concerns related to breastfeeding.   Maternal Data    Feeding Feeding Type: Breast Fed Length of feed: 15 min (per mom start time, LC checked latch )  LATCH Score/Interventions Latch: Grasps breast easily, tongue down, lips flanged, rhythmical sucking.  Audible Swallowing: Spontaneous and intermittent  Type of Nipple: Everted at rest and after stimulation  Comfort (Breast/Nipple): Filling, red/small blisters or bruises, mild/mod discomfort     Hold (Positioning): No assistance needed to correctly position infant at breast.  LATCH Score: 9  Lactation Tools Discussed/Used Tools: Shells;Pump Shell Type: Inverted Breast pump type: Manual   Consult Status Consult Status: Complete Date: 05/29/16    Kathrin Greathouseorio, Ezeriah Luty  Ann 05/29/2016, 9:48 AM

## 2016-07-20 ENCOUNTER — Encounter: Payer: Self-pay | Admitting: Neurology

## 2016-07-20 ENCOUNTER — Ambulatory Visit (INDEPENDENT_AMBULATORY_CARE_PROVIDER_SITE_OTHER): Payer: BLUE CROSS/BLUE SHIELD | Admitting: Neurology

## 2016-07-20 VITALS — BP 124/87 | HR 70 | Ht 64.0 in | Wt 209.8 lb

## 2016-07-20 DIAGNOSIS — R110 Nausea: Secondary | ICD-10-CM | POA: Diagnosis not present

## 2016-07-20 DIAGNOSIS — R51 Headache with orthostatic component, not elsewhere classified: Secondary | ICD-10-CM

## 2016-07-20 DIAGNOSIS — H539 Unspecified visual disturbance: Secondary | ICD-10-CM

## 2016-07-20 DIAGNOSIS — R519 Headache, unspecified: Secondary | ICD-10-CM

## 2016-07-20 DIAGNOSIS — G43009 Migraine without aura, not intractable, without status migrainosus: Secondary | ICD-10-CM

## 2016-07-20 NOTE — Progress Notes (Signed)
[facility]    Provider:  Dr Lucia GaskinsAhern Referring Provider: Harold Hedgeomblin, James, MD Primary Care Physician:  Leslie AndreaMBLIN II,JAMES E, MD  CC:  migraines  HPI:  Theresa Hull is a 25 y.o. female here as a referral from Dr. Henderson Cloudomblin for migraines. No significant PMHx.  Started after giving bith about 7 weeks ago, never had headaches like this before. Worsening, increased frequency and severity. They have been 10/10 pain and made her cry. Pain on the left side, around the eye, throbbing, light sensitivity, feels like her head is going to explode. Severe. She has had 5-6 in the last month. She tried 800mg  ibuprofen which helps. Mostly worse at night. Associated dizziness, nausea. 2 or 3 woke her up in themiddle of the night. If she take and ibuprofen it goes away, if she doesn't it can get severe. No FHx migraines. +Changes in vision. positional worse in the middle of the night when laying down. No other associated symptoms, no previous illnesses or head trauma, no significant hx of previous headaches. FHx of migraines. No weakness or other focal neurologic deficits.  Reviewed notes, labs and imaging from outside physicians, which showed:  CBC 6/22 with anemia 10.3/31.8 and elevated WBCs 13.9 otherwise unremarkable, rpr neg,   Review of Systems: Patient complains of symptoms per HPI as well as the following symptoms: Headache, spinning sensation. No CP, no SOB. Pertinent negatives per HPI. All others negative.   Social History   Social History  . Marital status: Single    Spouse name: N/A  . Number of children: 2  . Years of education: 16   Occupational History  . [employer]    Social History Main Topics  . Smoking status: Never Smoker  . Smokeless tobacco: Never Used  . Alcohol use No  . Drug use: No  . Sexual activity: Yes   Other Topics Concern  . Not on file   Social History Narrative   Lives with family   Caffeine use: Soda- 2 liter per week    Family History    Problem Relation Age of Onset  . Hypertension Mother   . Hypertension Maternal Grandmother   . Diabetes Maternal Grandmother     Past Medical History:  Diagnosis Date  . Medical history non-contributory     Past Surgical History:  Procedure Laterality Date  . APPENDECTOMY     5th grade    Current Outpatient Prescriptions  Medication Sig Dispense Refill  . ibuprofen (ADVIL,MOTRIN) 600 MG tablet Take 1 tablet (600 mg total) by mouth every 6 (six) hours. 30 tablet 1  . Prenatal Vit-Fe Fumarate-FA (PRENATAL MULTIVITAMIN) TABS tablet Take 1 tablet by mouth daily at 12 noon.     No current facility-administered medications for this visit.     Allergies as of 07/20/2016  . (No Known Allergies)    Vitals: BP 124/87 (BP Location: Right Arm, Patient Position: Sitting, Cuff Size: Normal)   Pulse 70   Ht 5\' 4"  (1.626 m)   Wt 209 lb 12.8 oz (95.2 kg)   BMI 36.01 kg/m  Last Weight:  Wt Readings from Last 1 Encounters:  07/20/16 209 lb 12.8 oz (95.2 kg)   Last Height:   Ht Readings from Last 1 Encounters:  07/20/16 5\' 4"  (1.626 m)    Physical exam: Exam: Gen: NAD, conversant, well nourised, obese, well groomed                     CV: RRR, no MRG. No  Carotid Bruits. No peripheral edema, warm, nontender Eyes: Conjunctivae clear without exudates or hemorrhage  Neuro: Detailed Neurologic Exam  Speech:    Speech is normal; fluent and spontaneous with normal comprehension.  Cognition:    The patient is oriented to person, place, and time;     recent and remote memory intact;     language fluent;     normal attention, concentration,     fund of knowledge Cranial Nerves:    The pupils are equal, round, and reactive to light. The fundi are normal and spontaneous venous pulsations are present. Visual fields are full to finger confrontation. Extraocular movements are intact. Trigeminal sensation is intact and the muscles of mastication are normal. The face is symmetric. The  palate elevates in the midline. Hearing intact. Voice is normal. Shoulder shrug is normal. The tongue has normal motion without fasciculations.   Coordination:    Normal finger to nose and heel to shin. Normal rapid alternating movements.   Gait:    Heel-toe and tandem gait are normal.   Motor Observation:    No asymmetry, no atrophy, and no involuntary movements noted. Tone:    Normal muscle tone.    Posture:    Posture is normal. normal erect    Strength:    Strength is V/V in the upper and lower limbs.      Sensation: intact to LT     Reflex Exam:  DTR's:    Deep tendon reflexes in the upper and lower extremities are normal bilaterally.   Toes:    The toes are downgoing bilaterally.   Clonus:    Clonus is absent.      Assessment/Plan:  25 year old with new onset severe headaches in the post-partum period. Will order an MRI of the brain. Patient prefers not to start any medication, she is breastfeeding. Discussed the following:  To prevent or relieve headaches, try the following: Cool Compress. Lie down and place a cool compress on your head.  Avoid headache triggers. If certain foods or odors seem to have triggered your migraines in the past, avoid them. A headache diary might help you identify triggers.  Include physical activity in your daily routine. Try a daily walk or other moderate aerobic exercise.  Manage stress. Find healthy ways to cope with the stressors, such as delegating tasks on your to-do list.  Practice relaxation techniques. Try deep breathing, yoga, massage and visualization.  Eat regularly. Eating regularly scheduled meals and maintaining a healthy diet might help prevent headaches. Also, drink plenty of fluids.  Follow a regular sleep schedule. Sleep deprivation might contribute to headaches Consider biofeedback. With this mind-body technique, you learn to control certain bodily functions - such as muscle tension, heart rate and blood pressure - to  prevent headaches or reduce headache pain.    Proceed to emergency room if you experience new or worsening symptoms or symptoms do not resolve, if you have new neurologic symptoms or if headache is severe, or for any concerning symptom.   Cc: Dr. Josefa Halfomblin   Antonia Ahern, MD  [facility] [address] [place] [id number]  Phone [phone] Fax [phone]  A total of 45 minutes was spent in with this patient face to face. Over half this time was spent on counseling patient on the migraine diagnosis and different therapeutic options available.

## 2016-07-20 NOTE — Patient Instructions (Signed)
Remember to drink plenty of fluid, eat healthy meals and do not skip any meals. Try to eat protein with a every meal and eat a healthy snack such as fruit or nuts in between meals. Try to keep a regular sleep-wake schedule and try to exercise daily, particularly in the form of walking, 20-30 minutes a day, if you can.   As far as your medications are concerned, I would like to suggest: Continue ibuprofen at onset of headache. Watch for gi upsetor dark stools, take with food  As far as diagnostic testing: MRI brain  I would like to see you back in 3-4 months if needed, sooner if we need to. Please call us with any interim questions, concerns, problems, updates or refill requests.   Our phone number is [phone]. We also have an after hours call service for urgent matters and there is a physician on-call for urgent questions. For any emergencies you know to call 911 or go to the nearest emergency room

## 2016-07-21 DIAGNOSIS — G43009 Migraine without aura, not intractable, without status migrainosus: Secondary | ICD-10-CM | POA: Insufficient documentation

## 2016-09-18 ENCOUNTER — Other Ambulatory Visit: Payer: BLUE CROSS/BLUE SHIELD

## 2020-04-29 LAB — OB RESULTS CONSOLE RPR: RPR: NONREACTIVE

## 2020-04-29 LAB — OB RESULTS CONSOLE HIV ANTIBODY (ROUTINE TESTING): HIV: NONREACTIVE

## 2020-04-29 LAB — OB RESULTS CONSOLE HEPATITIS B SURFACE ANTIGEN: Hepatitis B Surface Ag: NEGATIVE

## 2020-04-29 LAB — OB RESULTS CONSOLE RUBELLA ANTIBODY, IGM: Rubella: IMMUNE

## 2020-05-09 LAB — OB RESULTS CONSOLE GC/CHLAMYDIA
Chlamydia: NEGATIVE
Gonorrhea: NEGATIVE

## 2020-11-10 LAB — OB RESULTS CONSOLE GBS: GBS: NEGATIVE

## 2020-11-11 ENCOUNTER — Encounter (HOSPITAL_COMMUNITY): Payer: Self-pay | Admitting: *Deleted

## 2020-11-11 ENCOUNTER — Telehealth (HOSPITAL_COMMUNITY): Payer: Self-pay | Admitting: *Deleted

## 2020-11-11 NOTE — Telephone Encounter (Signed)
Preadmission screen  

## 2020-11-18 NOTE — H&P (Signed)
Theresa Hull is a 29 y.o. female presenting for two stage IOL. Pregnancy complicated by Hx of fast labor. OB History    Gravida  3   Para  2   Term  2   Preterm      AB      Living  2     SAB      IAB      Ectopic      Multiple  0   Live Births  2          Past Medical History:  Diagnosis Date  . Medical history non-contributory    Past Surgical History:  Procedure Laterality Date  . APPENDECTOMY     5th grade   Family History: family history includes Diabetes in her maternal grandmother; Hypertension in her maternal grandmother and mother. Social History:  reports that she has never smoked. She has never used smokeless tobacco. She reports that she does not drink alcohol and does not use drugs.     Maternal Diabetes: No Genetic Screening: Normal Maternal Ultrasounds/Referrals: Normal Fetal Ultrasounds or other Referrals:  None Maternal Substance Abuse:  No Significant Maternal Medications:  None Significant Maternal Lab Results:  Group B Strep negative Other Comments:  None  Review of Systems  Constitutional: Negative for fever.  Eyes: Negative for visual disturbance.  Gastrointestinal: Negative for abdominal pain.  Neurological: Negative for headaches.   Maternal Medical History:  Fetal activity: Perceived fetal activity is normal.        unknown if currently breastfeeding. Maternal Exam:  Abdomen: Fetal presentation: vertex     Physical Exam Cardiovascular:     Rate and Rhythm: Normal rate.  Pulmonary:     Effort: Pulmonary effort is normal.     Prenatal labs: ABO, Rh:   Antibody:   Rubella:   RPR:    HBsAg:    HIV:    GBS:   negative  11/14/20  Assessment/Plan: 29 yo G2P2 at term for two stage IOL   Roselle Locus II 11/18/2020, 5:34 PM

## 2020-11-19 ENCOUNTER — Other Ambulatory Visit (HOSPITAL_COMMUNITY): Admission: RE | Admit: 2020-11-19 | Payer: Medicaid Other | Source: Ambulatory Visit

## 2020-11-20 ENCOUNTER — Encounter (HOSPITAL_COMMUNITY): Payer: BLUE CROSS/BLUE SHIELD

## 2020-11-21 ENCOUNTER — Encounter (HOSPITAL_COMMUNITY)
Admission: RE | Disposition: A | Discharge status: Home / Self Care | Payer: Self-pay | Source: Home / Self Care | Attending: Obstetrics and Gynecology

## 2020-11-21 ENCOUNTER — Encounter (HOSPITAL_COMMUNITY): Payer: Self-pay | Admitting: Obstetrics and Gynecology

## 2020-11-21 ENCOUNTER — Inpatient Hospital Stay (HOSPITAL_COMMUNITY)
Admission: RE | Admit: 2020-11-21 | Discharge: 2020-11-24 | DRG: 788 | Disposition: A | Discharge status: Home / Self Care | Payer: Medicaid Other | Attending: Obstetrics and Gynecology | Admitting: Obstetrics and Gynecology

## 2020-11-21 ENCOUNTER — Inpatient Hospital Stay (HOSPITAL_COMMUNITY): Payer: Medicaid Other | Admitting: Anesthesiology

## 2020-11-21 ENCOUNTER — Other Ambulatory Visit (HOSPITAL_COMMUNITY): Payer: Self-pay

## 2020-11-21 ENCOUNTER — Other Ambulatory Visit: Payer: Self-pay

## 2020-11-21 ENCOUNTER — Inpatient Hospital Stay (HOSPITAL_COMMUNITY): Payer: Medicaid Other

## 2020-11-21 DIAGNOSIS — Z20822 Contact with and (suspected) exposure to covid-19: Secondary | ICD-10-CM | POA: Diagnosis present

## 2020-11-21 DIAGNOSIS — O99214 Obesity complicating childbirth: Secondary | ICD-10-CM | POA: Diagnosis present

## 2020-11-21 DIAGNOSIS — Z3490 Encounter for supervision of normal pregnancy, unspecified, unspecified trimester: Secondary | ICD-10-CM

## 2020-11-21 DIAGNOSIS — O26893 Other specified pregnancy related conditions, third trimester: Secondary | ICD-10-CM | POA: Diagnosis present

## 2020-11-21 DIAGNOSIS — Z3A39 39 weeks gestation of pregnancy: Secondary | ICD-10-CM

## 2020-11-21 LAB — RESP PANEL BY RT-PCR (FLU A&B, COVID) ARPGX2
Influenza A by PCR: NEGATIVE
Influenza B by PCR: NEGATIVE
SARS Coronavirus 2 by RT PCR: NEGATIVE

## 2020-11-21 LAB — CBC
HCT: 36.1 % (ref 36.0–46.0)
Hemoglobin: 10.7 g/dL — ABNORMAL LOW (ref 12.0–15.0)
MCH: 25.1 pg — ABNORMAL LOW (ref 26.0–34.0)
MCHC: 29.6 g/dL — ABNORMAL LOW (ref 30.0–36.0)
MCV: 84.5 fL (ref 80.0–100.0)
Platelets: 320 10*3/uL (ref 150–400)
RBC: 4.27 MIL/uL (ref 3.87–5.11)
RDW: 14 % (ref 11.5–15.5)
WBC: 7.9 10*3/uL (ref 4.0–10.5)
nRBC: 0 % (ref 0.0–0.2)

## 2020-11-21 LAB — RPR: RPR Ser Ql: NONREACTIVE

## 2020-11-21 LAB — TYPE AND SCREEN
ABO/RH(D): O POS
Antibody Screen: NEGATIVE

## 2020-11-21 SURGERY — Surgical Case
Anesthesia: Epidural

## 2020-11-21 MED ORDER — EPHEDRINE 5 MG/ML INJ: 10.0000 mg | INTRAVENOUS | Status: DC | PRN

## 2020-11-21 MED ORDER — DIBUCAINE (PERIANAL) 1 % EX OINT: 1.0000 "application " | TOPICAL_OINTMENT | CUTANEOUS | Status: DC | PRN

## 2020-11-21 MED ORDER — LACTATED RINGERS IV SOLN: INTRAVENOUS | Status: DC

## 2020-11-21 MED ORDER — OXYTOCIN BOLUS FROM INFUSION: 333.0000 mL | Freq: Once | INTRAVENOUS | Status: DC

## 2020-11-21 MED ORDER — SOD CITRATE-CITRIC ACID 500-334 MG/5ML PO SOLN: 30.0000 mL | ORAL | Status: DC | PRN

## 2020-11-21 MED ORDER — ONDANSETRON HCL 4 MG/2ML IJ SOLN
INTRAMUSCULAR | Status: DC | PRN
  Administered 2020-11-21 (×2): 4 mg via INTRAVENOUS

## 2020-11-21 MED ORDER — NALBUPHINE HCL 10 MG/ML IJ SOLN
5.0000 mg | INTRAMUSCULAR | Status: DC | PRN
  Administered 2020-11-21 – 2020-11-22 (×2): 5 mg via INTRAVENOUS
  Filled 2020-11-21 (×2): qty 1

## 2020-11-21 MED ORDER — SCOPOLAMINE 1 MG/3DAYS TD PT72: 1.0000 | MEDICATED_PATCH | Freq: Once | TRANSDERMAL | Status: DC

## 2020-11-21 MED ORDER — HYDROMORPHONE HCL 1 MG/ML IJ SOLN: 0.2000 mg | INTRAMUSCULAR | Status: DC | PRN

## 2020-11-21 MED ORDER — SIMETHICONE 80 MG PO CHEW
80.0000 mg | CHEWABLE_TABLET | Freq: Three times a day (TID) | ORAL | Status: DC
  Administered 2020-11-22 – 2020-11-24 (×5): 80 mg via ORAL
  Filled 2020-11-21 (×6): qty 1

## 2020-11-21 MED ORDER — OXYTOCIN-SODIUM CHLORIDE 30-0.9 UT/500ML-% IV SOLN
INTRAVENOUS | Status: AC
  Filled 2020-11-21: qty 500

## 2020-11-21 MED ORDER — ONDANSETRON HCL 4 MG/2ML IJ SOLN
4.0000 mg | Freq: Three times a day (TID) | INTRAMUSCULAR | Status: DC | PRN
Start: 2020-11-21 — End: 2020-11-24

## 2020-11-21 MED ORDER — PHENYLEPHRINE 40 MCG/ML (10ML) SYRINGE FOR IV PUSH (FOR BLOOD PRESSURE SUPPORT)
80.0000 ug | PREFILLED_SYRINGE | INTRAVENOUS | Status: DC | PRN
  Administered 2020-11-21: 14:00:00 80 ug via INTRAVENOUS

## 2020-11-21 MED ORDER — MISOPROSTOL 25 MCG QUARTER TABLET
25.0000 ug | ORAL_TABLET | ORAL | Status: DC | PRN
  Administered 2020-11-21 (×2): 25 ug via VAGINAL
  Filled 2020-11-21 (×2): qty 1

## 2020-11-21 MED ORDER — FLEET ENEMA 7-19 GM/118ML RE ENEM: 1.0000 | ENEMA | RECTAL | Status: DC | PRN

## 2020-11-21 MED ORDER — CEFAZOLIN SODIUM-DEXTROSE 2-3 GM-%(50ML) IV SOLR
INTRAVENOUS | Status: DC | PRN
  Administered 2020-11-21: 2 g via INTRAVENOUS

## 2020-11-21 MED ORDER — MEPERIDINE HCL 25 MG/ML IJ SOLN
INTRAMUSCULAR | Status: AC
  Filled 2020-11-21: qty 1

## 2020-11-21 MED ORDER — PRENATAL MULTIVITAMIN CH
1.0000 | ORAL_TABLET | Freq: Every day | ORAL | Status: DC
  Administered 2020-11-22 – 2020-11-24 (×3): 1 via ORAL
  Filled 2020-11-21 (×3): qty 1

## 2020-11-21 MED ORDER — LIDOCAINE-EPINEPHRINE (PF) 2 %-1:200000 IJ SOLN
INTRAMUSCULAR | Status: DC | PRN
  Administered 2020-11-21: 3 mL via EPIDURAL
  Administered 2020-11-21: 2 mL via EPIDURAL

## 2020-11-21 MED ORDER — DIPHENHYDRAMINE HCL 25 MG PO CAPS: 25.0000 mg | ORAL_CAPSULE | Freq: Four times a day (QID) | ORAL | Status: DC | PRN

## 2020-11-21 MED ORDER — MORPHINE SULFATE (PF) 0.5 MG/ML IJ SOLN
INTRAMUSCULAR | Status: DC | PRN
  Administered 2020-11-21: 3 mg via EPIDURAL

## 2020-11-21 MED ORDER — SODIUM CHLORIDE 0.9 % IV SOLN: INTRAVENOUS | Status: DC | PRN

## 2020-11-21 MED ORDER — DIPHENHYDRAMINE HCL 25 MG PO CAPS: 25.0000 mg | ORAL_CAPSULE | ORAL | Status: DC | PRN

## 2020-11-21 MED ORDER — DIPHENHYDRAMINE HCL 50 MG/ML IJ SOLN
12.5000 mg | INTRAMUSCULAR | Status: DC | PRN
Start: 2020-11-21 — End: 2020-11-21

## 2020-11-21 MED ORDER — HYDROMORPHONE HCL 1 MG/ML IJ SOLN: 0.2500 mg | INTRAMUSCULAR | Status: DC | PRN

## 2020-11-21 MED ORDER — KETOROLAC TROMETHAMINE 30 MG/ML IJ SOLN: 30.0000 mg | Freq: Once | INTRAMUSCULAR | Status: DC | PRN

## 2020-11-21 MED ORDER — TERBUTALINE SULFATE 1 MG/ML IJ SOLN: 0.2500 mg | Freq: Once | INTRAMUSCULAR | Status: DC | PRN

## 2020-11-21 MED ORDER — DEXTROSE 5 % IV SOLN
1.0000 ug/kg/h | INTRAVENOUS | Status: DC | PRN
  Filled 2020-11-21: qty 5

## 2020-11-21 MED ORDER — PHENYLEPHRINE 40 MCG/ML (10ML) SYRINGE FOR IV PUSH (FOR BLOOD PRESSURE SUPPORT)
80.0000 ug | PREFILLED_SYRINGE | INTRAVENOUS | Status: DC | PRN
  Filled 2020-11-21: qty 10

## 2020-11-21 MED ORDER — FENTANYL-BUPIVACAINE-NACL 0.5-0.125-0.9 MG/250ML-% EP SOLN
EPIDURAL | Status: AC
  Filled 2020-11-21: qty 250

## 2020-11-21 MED ORDER — MORPHINE SULFATE (PF) 0.5 MG/ML IJ SOLN
INTRAMUSCULAR | Status: AC
  Filled 2020-11-21: qty 10

## 2020-11-21 MED ORDER — NALBUPHINE HCL 10 MG/ML IJ SOLN: 5.0000 mg | Freq: Once | INTRAMUSCULAR | Status: DC | PRN

## 2020-11-21 MED ORDER — SODIUM CHLORIDE (PF) 0.9 % IJ SOLN
INTRAMUSCULAR | Status: DC | PRN
  Administered 2020-11-21: 12 mL/h via EPIDURAL

## 2020-11-21 MED ORDER — LIDOCAINE HCL (PF) 1 % IJ SOLN: 30.0000 mL | INTRAMUSCULAR | Status: DC | PRN

## 2020-11-21 MED ORDER — MENTHOL 3 MG MT LOZG: 1.0000 | LOZENGE | OROMUCOSAL | Status: DC | PRN

## 2020-11-21 MED ORDER — KETOROLAC TROMETHAMINE 30 MG/ML IJ SOLN: 30.0000 mg | Freq: Four times a day (QID) | INTRAMUSCULAR | Status: AC | PRN

## 2020-11-21 MED ORDER — MEPERIDINE HCL 25 MG/ML IJ SOLN
INTRAMUSCULAR | Status: DC | PRN
  Administered 2020-11-21: 12.5 mg via INTRAVENOUS

## 2020-11-21 MED ORDER — LACTATED RINGERS IV SOLN
500.0000 mL | Freq: Once | INTRAVENOUS | Status: AC
  Administered 2020-11-21: 11:00:00 500 mL via INTRAVENOUS

## 2020-11-21 MED ORDER — OXYCODONE-ACETAMINOPHEN 5-325 MG PO TABS: 2.0000 | ORAL_TABLET | ORAL | Status: DC | PRN

## 2020-11-21 MED ORDER — NALBUPHINE HCL 10 MG/ML IJ SOLN: 5.0000 mg | INTRAMUSCULAR | Status: DC | PRN

## 2020-11-21 MED ORDER — SODIUM CHLORIDE 0.9 % IR SOLN
Status: DC | PRN
  Administered 2020-11-21: 1000 mL

## 2020-11-21 MED ORDER — LACTATED RINGERS AMNIOINFUSION: INTRAVENOUS | Status: DC

## 2020-11-21 MED ORDER — LACTATED RINGERS IV SOLN: INTRAVENOUS | Status: DC | PRN

## 2020-11-21 MED ORDER — MEPERIDINE HCL 25 MG/ML IJ SOLN
6.2500 mg | INTRAMUSCULAR | Status: DC | PRN
Start: 2020-11-21 — End: 2020-11-21

## 2020-11-21 MED ORDER — FENTANYL CITRATE (PF) 100 MCG/2ML IJ SOLN
50.0000 ug | INTRAMUSCULAR | Status: DC | PRN
  Administered 2020-11-21 (×5): 100 ug via INTRAVENOUS
  Filled 2020-11-21 (×5): qty 2

## 2020-11-21 MED ORDER — OXYTOCIN-SODIUM CHLORIDE 30-0.9 UT/500ML-% IV SOLN: 2.5000 [IU]/h | INTRAVENOUS | Status: AC

## 2020-11-21 MED ORDER — OXYCODONE HCL 5 MG PO TABS
5.0000 mg | ORAL_TABLET | ORAL | Status: DC | PRN
Start: 2020-11-21 — End: 2020-11-22
  Administered 2020-11-22: 10 mg via ORAL
  Filled 2020-11-21: qty 2

## 2020-11-21 MED ORDER — PROMETHAZINE HCL 25 MG/ML IJ SOLN: 6.2500 mg | INTRAMUSCULAR | Status: DC | PRN

## 2020-11-21 MED ORDER — ACETAMINOPHEN 500 MG PO TABS
1000.0000 mg | ORAL_TABLET | Freq: Four times a day (QID) | ORAL | Status: AC
  Administered 2020-11-21 – 2020-11-22 (×3): 1000 mg via ORAL
  Filled 2020-11-21 (×3): qty 2

## 2020-11-21 MED ORDER — COCONUT OIL OIL: 1.0000 "application " | TOPICAL_OIL | Status: DC | PRN

## 2020-11-21 MED ORDER — HYDROXYZINE HCL 50 MG PO TABS: 50.0000 mg | ORAL_TABLET | Freq: Four times a day (QID) | ORAL | Status: DC | PRN

## 2020-11-21 MED ORDER — OXYCODONE-ACETAMINOPHEN 5-325 MG PO TABS: 1.0000 | ORAL_TABLET | ORAL | Status: DC | PRN

## 2020-11-21 MED ORDER — SODIUM CHLORIDE 0.9% FLUSH: 3.0000 mL | INTRAVENOUS | Status: DC | PRN

## 2020-11-21 MED ORDER — DEXAMETHASONE SODIUM PHOSPHATE 10 MG/ML IJ SOLN
INTRAMUSCULAR | Status: AC
  Filled 2020-11-21: qty 1

## 2020-11-21 MED ORDER — NALOXONE HCL 0.4 MG/ML IJ SOLN
0.4000 mg | INTRAMUSCULAR | Status: DC | PRN
Start: 2020-11-21 — End: 2020-11-24

## 2020-11-21 MED ORDER — ZOLPIDEM TARTRATE 5 MG PO TABS: 5.0000 mg | ORAL_TABLET | Freq: Every evening | ORAL | Status: DC | PRN

## 2020-11-21 MED ORDER — SODIUM BICARBONATE 8.4 % IV SOLN
INTRAVENOUS | Status: DC | PRN
  Administered 2020-11-21: 10 mL via EPIDURAL
  Administered 2020-11-21: 8 mL via EPIDURAL

## 2020-11-21 MED ORDER — OXYCODONE HCL 5 MG/5ML PO SOLN: 5.0000 mg | Freq: Once | ORAL | Status: DC | PRN

## 2020-11-21 MED ORDER — LACTATED RINGERS IV SOLN: 500.0000 mL | INTRAVENOUS | Status: DC | PRN

## 2020-11-21 MED ORDER — OXYTOCIN-SODIUM CHLORIDE 30-0.9 UT/500ML-% IV SOLN
2.5000 [IU]/h | INTRAVENOUS | Status: DC
  Filled 2020-11-21: qty 500

## 2020-11-21 MED ORDER — ONDANSETRON HCL 4 MG/2ML IJ SOLN
4.0000 mg | Freq: Four times a day (QID) | INTRAMUSCULAR | Status: DC | PRN
  Administered 2020-11-21: 4 mg via INTRAVENOUS
  Filled 2020-11-21: qty 2

## 2020-11-21 MED ORDER — SENNOSIDES-DOCUSATE SODIUM 8.6-50 MG PO TABS
2.0000 | ORAL_TABLET | Freq: Every day | ORAL | Status: DC
  Administered 2020-11-22 – 2020-11-24 (×2): 2 via ORAL
  Filled 2020-11-21 (×2): qty 2

## 2020-11-21 MED ORDER — WITCH HAZEL-GLYCERIN EX PADS: 1.0000 "application " | MEDICATED_PAD | CUTANEOUS | Status: DC | PRN

## 2020-11-21 MED ORDER — SIMETHICONE 80 MG PO CHEW
80.0000 mg | CHEWABLE_TABLET | ORAL | Status: DC | PRN
  Administered 2020-11-22 – 2020-11-23 (×2): 80 mg via ORAL
  Filled 2020-11-21 (×2): qty 1

## 2020-11-21 MED ORDER — SODIUM BICARBONATE 8.4 % IV SOLN
INTRAVENOUS | Status: DC | PRN
  Administered 2020-11-21: 15:00:00 20 mL via PERINEURAL

## 2020-11-21 MED ORDER — TETANUS-DIPHTH-ACELL PERTUSSIS 5-2.5-18.5 LF-MCG/0.5 IM SUSY: 0.5000 mL | PREFILLED_SYRINGE | Freq: Once | INTRAMUSCULAR | Status: DC

## 2020-11-21 MED ORDER — DIPHENHYDRAMINE HCL 50 MG/ML IJ SOLN
12.5000 mg | INTRAMUSCULAR | Status: DC | PRN
  Administered 2020-11-21: 22:00:00 12.5 mg via INTRAVENOUS
  Filled 2020-11-21: qty 1

## 2020-11-21 MED ORDER — ACETAMINOPHEN 325 MG PO TABS
650.0000 mg | ORAL_TABLET | ORAL | Status: DC | PRN
  Administered 2020-11-22 – 2020-11-24 (×5): 650 mg via ORAL
  Filled 2020-11-21 (×5): qty 2

## 2020-11-21 MED ORDER — ONDANSETRON HCL 4 MG/2ML IJ SOLN
INTRAMUSCULAR | Status: AC
  Filled 2020-11-21: qty 2

## 2020-11-21 MED ORDER — ACETAMINOPHEN 325 MG PO TABS: 650.0000 mg | ORAL_TABLET | ORAL | Status: DC | PRN

## 2020-11-21 MED ORDER — OXYTOCIN-SODIUM CHLORIDE 30-0.9 UT/500ML-% IV SOLN
INTRAVENOUS | Status: DC | PRN
  Administered 2020-11-21: 30 [IU] via INTRAVENOUS

## 2020-11-21 MED ORDER — OXYCODONE HCL 5 MG PO TABS: 5.0000 mg | ORAL_TABLET | Freq: Once | ORAL | Status: DC | PRN

## 2020-11-21 MED ORDER — MORPHINE SULFATE (PF) 10 MG/ML IV SOLN
INTRAVENOUS | Status: DC | PRN
  Administered 2020-11-21 (×2): .5 mg via INTRAVENOUS

## 2020-11-21 MED ORDER — IBUPROFEN 600 MG PO TABS
600.0000 mg | ORAL_TABLET | Freq: Four times a day (QID) | ORAL | Status: DC | PRN
  Administered 2020-11-22 – 2020-11-24 (×7): 600 mg via ORAL
  Filled 2020-11-21 (×7): qty 1

## 2020-11-21 MED ORDER — FENTANYL-BUPIVACAINE-NACL 0.5-0.125-0.9 MG/250ML-% EP SOLN: 12.0000 mL/h | EPIDURAL | Status: DC | PRN

## 2020-11-21 MED ORDER — DEXAMETHASONE SODIUM PHOSPHATE 10 MG/ML IJ SOLN
INTRAMUSCULAR | Status: DC | PRN
  Administered 2020-11-21: 10 mg via INTRAVENOUS

## 2020-11-21 SURGICAL SUPPLY — 39 items
BENZOIN TINCTURE PRP APPL 2/3 (GAUZE/BANDAGES/DRESSINGS) ×3 IMPLANT
CHLORAPREP W/TINT 26ML (MISCELLANEOUS) ×3 IMPLANT
CLAMP CORD UMBIL (MISCELLANEOUS) IMPLANT
CLOSURE WOUND 1/2 X4 (GAUZE/BANDAGES/DRESSINGS) ×1
CLOTH BEACON ORANGE TIMEOUT ST (SAFETY) ×3 IMPLANT
DERMABOND ADVANCED (GAUZE/BANDAGES/DRESSINGS)
DERMABOND ADVANCED .7 DNX12 (GAUZE/BANDAGES/DRESSINGS) IMPLANT
DRSG OPSITE POSTOP 4X10 (GAUZE/BANDAGES/DRESSINGS) ×3 IMPLANT
ELECT REM PT RETURN 9FT ADLT (ELECTROSURGICAL) ×3
ELECTRODE REM PT RTRN 9FT ADLT (ELECTROSURGICAL) ×1 IMPLANT
EXTRACTOR VACUUM M CUP 4 TUBE (SUCTIONS) IMPLANT
EXTRACTOR VACUUM M CUP 4' TUBE (SUCTIONS)
GAUZE SPONGE 4X4 12PLY STRL LF (GAUZE/BANDAGES/DRESSINGS) ×3 IMPLANT
GLOVE BIO SURGEON STRL SZ7.5 (GLOVE) ×3 IMPLANT
GLOVE BIOGEL PI IND STRL 7.0 (GLOVE) ×1 IMPLANT
GLOVE BIOGEL PI INDICATOR 7.0 (GLOVE) ×2
GOWN STRL REUS W/TWL LRG LVL3 (GOWN DISPOSABLE) ×6 IMPLANT
HEMOSTAT ARISTA ABSORB 3G PWDR (HEMOSTASIS) ×3 IMPLANT
KIT ABG SYR 3ML LUER SLIP (SYRINGE) ×3 IMPLANT
NEEDLE HYPO 25X5/8 SAFETYGLIDE (NEEDLE) ×3 IMPLANT
NS IRRIG 1000ML POUR BTL (IV SOLUTION) ×3 IMPLANT
PACK C SECTION WH (CUSTOM PROCEDURE TRAY) ×3 IMPLANT
PAD ABD 7.5X8 STRL (GAUZE/BANDAGES/DRESSINGS) ×3 IMPLANT
PAD OB MATERNITY 4.3X12.25 (PERSONAL CARE ITEMS) ×3 IMPLANT
PENCIL SMOKE EVAC W/HOLSTER (ELECTROSURGICAL) ×3 IMPLANT
SPONGE LAP 18X18 RF (DISPOSABLE) ×3 IMPLANT
STRIP CLOSURE SKIN 1/2X4 (GAUZE/BANDAGES/DRESSINGS) ×2 IMPLANT
SUT CHROMIC 2 0 SH (SUTURE) ×9 IMPLANT
SUT MNCRL 0 VIOLET CTX 36 (SUTURE) ×6 IMPLANT
SUT MONOCRYL 0 CTX 36 (SUTURE) ×12
SUT PDS AB 0 CTX 60 (SUTURE) ×3 IMPLANT
SUT PLAIN 0 NONE (SUTURE) IMPLANT
SUT PLAIN 2 0 (SUTURE) ×2
SUT PLAIN 2 0 XLH (SUTURE) IMPLANT
SUT PLAIN ABS 2-0 CT1 27XMFL (SUTURE) ×1 IMPLANT
SUT VIC AB 4-0 KS 27 (SUTURE) ×3 IMPLANT
TOWEL OR 17X24 6PK STRL BLUE (TOWEL DISPOSABLE) ×3 IMPLANT
TRAY FOLEY W/BAG SLVR 14FR LF (SET/KITS/TRAYS/PACK) ×3 IMPLANT
WATER STERILE IRR 1000ML POUR (IV SOLUTION) ×3 IMPLANT

## 2020-11-21 NOTE — Anesthesia Procedure Notes (Signed)
Epidural Patient location during procedure: OB Start time: 11/21/2020 11:04 AM End time: 11/21/2020 11:15 AM  Staffing Anesthesiologist: Elmer Picker, MD Performed: anesthesiologist   Preanesthetic Checklist Completed: patient identified, IV checked, risks and benefits discussed, monitors and equipment checked, pre-op evaluation and timeout performed  Epidural Patient position: sitting Prep: DuraPrep and site prepped and draped Patient monitoring: continuous pulse ox, blood pressure, heart rate and cardiac monitor Approach: midline Location: L3-L4 Injection technique: LOR air  Needle:  Needle type: Tuohy  Needle gauge: 17 G Needle length: 9 cm Needle insertion depth: 8 cm Catheter type: closed end flexible Catheter size: 19 Gauge Catheter at skin depth: 14 cm Test dose: negative  Assessment Sensory level: T8 Events: blood not aspirated, injection not painful, no injection resistance, no paresthesia and negative IV test  Additional Notes Patient identified. Risks/Benefits/Options discussed with patient including but not limited to bleeding, infection, nerve damage, paralysis, failed block, incomplete pain control, headache, blood pressure changes, nausea, vomiting, reactions to medication both or allergic, itching and postpartum back pain. Confirmed with bedside nurse the patient's most recent platelet count. Confirmed with patient that they are not currently taking any anticoagulation, have any bleeding history or any family history of bleeding disorders. Patient expressed understanding and wished to proceed. All questions were answered. Sterile technique was used throughout the entire procedure. Please see nursing notes for vital signs. Test dose was given through epidural catheter and negative prior to continuing to dose epidural or start infusion. Warning signs of high block given to the patient including shortness of breath, tingling/numbness in hands, complete motor  block, or any concerning symptoms with instructions to call for help. Patient was given instructions on fall risk and not to get out of bed. All questions and concerns addressed with instructions to call with any issues or inadequate analgesia.  Reason for block:procedure for pain

## 2020-11-21 NOTE — Transfer of Care (Signed)
Immediate Anesthesia Transfer of Care Note  Patient: Theresa Hull  Procedure(s) Performed: CESAREAN SECTION (N/A )  Patient Location: PACU  Anesthesia Type:Epidural  Level of Consciousness: awake, alert  and oriented  Airway & Oxygen Therapy: Patient Spontanous Breathing  Post-op Assessment: Report given to RN and Post -op Vital signs reviewed and stable  Post vital signs: Reviewed and stable  Last Vitals:  Vitals Value Taken Time  BP    Temp    Pulse    Resp    SpO2      Last Pain:  Vitals:   11/21/20 0650  TempSrc:   PainSc: 7          Complications: No complications documented.

## 2020-11-21 NOTE — Progress Notes (Signed)
Cx no change UCs 280 FHT repetitive late decels despite amnioinfusion and phenylephrine IV  A/P: Non reassuring FHT and arrest of dilation Recommend C/S-D/W patient above and risks including infection, organ damage, bleeding/transfusion-HIV/Hep, DVT/PE, pneumonia. She states she understands and agrees.

## 2020-11-21 NOTE — Brief Op Note (Signed)
11/21/2020  3:12 PM  PATIENT:  Theresa Hull  29 y.o. female  PRE-OPERATIVE DIAGNOSIS:  fetal intolerance to labor, arrest of dilation  POST-OPERATIVE DIAGNOSIS:  fetal intolerance to labor, arrest of dilation  PROCEDURE:  Procedure(s): CESAREAN SECTION (N/A)  SURGEON:  Surgeon(s) and Role:    * Harold Hedge, MD - Primary  PHYSICIAN ASSISTANT:   ASSISTANTS: none   ANESTHESIA:   epidural  EBL:  599   BLOOD ADMINISTERED:none  DRAINS: Urinary Catheter (Foley)   LOCAL MEDICATIONS USED:  Amount: 20 ml and OTHER Nesicaine  SPECIMEN:  Source of Specimen:  placenta  DISPOSITION OF SPECIMEN:  PATHOLOGY  COUNTS:  YES  TOURNIQUET:  * No tourniquets in log *  DICTATION: .Other Dictation: Dictation Number [id number]  PLAN OF CARE: Admit to inpatient   PATIENT DISPOSITION:  PACU - hemodynamically stable.   Delay start of Pharmacological VTE agent (>24hrs) due to surgical blood loss or risk of bleeding: not applicable

## 2020-11-21 NOTE — Anesthesia Preprocedure Evaluation (Signed)
Anesthesia Evaluation  Patient identified by MRN, date of birth, ID band Patient awake    Reviewed: Allergy & Precautions, NPO status , Patient's Chart, lab work & pertinent test results  Airway Mallampati: III  TM Distance: >3 FB Neck ROM: Full    Dental no notable dental hx.    Pulmonary neg pulmonary ROS,    Pulmonary exam normal breath sounds clear to auscultation       Cardiovascular negative cardio ROS Normal cardiovascular exam Rhythm:Regular Rate:Normal     Neuro/Psych  Headaches, negative psych ROS   GI/Hepatic negative GI ROS, Neg liver ROS,   Endo/Other  Morbid obesity (BMI 40)  Renal/GU negative Renal ROS  negative genitourinary   Musculoskeletal negative musculoskeletal ROS (+)   Abdominal   Peds  Hematology  (+) Blood dyscrasia (Hgb 10.7), anemia ,   Anesthesia Other Findings IOL at term  Reproductive/Obstetrics (+) Pregnancy                             Anesthesia Physical Anesthesia Plan  ASA: III  Anesthesia Plan: Epidural   Post-op Pain Management:    Induction:   PONV Risk Score and Plan: Treatment may vary due to age or medical condition  Airway Management Planned: Natural Airway  Additional Equipment:   Intra-op Plan:   Post-operative Plan:   Informed Consent: I have reviewed the patients History and Physical, chart, labs and discussed the procedure including the risks, benefits and alternatives for the proposed anesthesia with the patient or authorized representative who has indicated his/her understanding and acceptance.       Plan Discussed with: Anesthesiologist  Anesthesia Plan Comments: (Patient identified. Risks, benefits, options discussed with patient including but not limited to bleeding, infection, nerve damage, paralysis, failed block, incomplete pain control, headache, blood pressure changes, nausea, vomiting, reactions to medication,  itching, and post partum back pain. Confirmed with bedside nurse the patient's most recent platelet count. Confirmed with the patient that they are not taking any anticoagulation, have any bleeding history or any family history of bleeding disorders. Patient expressed understanding and wishes to proceed. All questions were answered. )        Anesthesia Quick Evaluation

## 2020-11-21 NOTE — Anesthesia Postprocedure Evaluation (Signed)
Anesthesia Post Note  Patient: Theresa Hull  Procedure(s) Performed: CESAREAN SECTION (N/A )     Patient location during evaluation: PACU Anesthesia Type: Epidural Level of consciousness: awake and alert Pain management: pain level controlled Vital Signs Assessment: post-procedure vital signs reviewed and stable Respiratory status: spontaneous breathing, nonlabored ventilation and respiratory function stable Cardiovascular status: stable Postop Assessment: no headache, no backache, epidural receding, no apparent nausea or vomiting and patient able to bend at knees Anesthetic complications: no   No complications documented.  Last Vitals:  Vitals:   11/21/20 1604 11/21/20 1605  BP:    Pulse: 94 92  Resp: 18 11  Temp:    SpO2: 100% 99%    Last Pain:  Vitals:   11/21/20 1525  TempSrc:   PainSc: 0-No pain   Pain Goal:                Epidural/Spinal Function Cutaneous sensation: No Sensation (11/21/20 1525), Patient able to flex knees: No (11/21/20 1525), Patient able to lift hips off bed: No (11/21/20 1525), Back pain beyond tenderness at insertion site: No (11/21/20 1525), Progressively worsening motor and/or sensory loss: No (11/21/20 1525), Bowel and/or bladder incontinence post epidural: No (11/21/20 1525)  [place]

## 2020-11-21 NOTE — Progress Notes (Signed)
No change to H&P Cx 4/C/-2/vtx AROM earlier for clear fluid FHT cat one UCs q2-3 min Feeling a lot of pelvic pressure>epidural

## 2020-11-22 ENCOUNTER — Encounter (HOSPITAL_COMMUNITY): Payer: Self-pay | Admitting: Obstetrics and Gynecology

## 2020-11-22 LAB — CBC
HCT: 29.3 % — ABNORMAL LOW (ref 36.0–46.0)
Hemoglobin: 9.1 g/dL — ABNORMAL LOW (ref 12.0–15.0)
MCH: 25.9 pg — ABNORMAL LOW (ref 26.0–34.0)
MCHC: 31.1 g/dL (ref 30.0–36.0)
MCV: 83.2 fL (ref 80.0–100.0)
Platelets: 255 10*3/uL (ref 150–400)
RBC: 3.52 MIL/uL — ABNORMAL LOW (ref 3.87–5.11)
RDW: 13.9 % (ref 11.5–15.5)
WBC: 16 10*3/uL — ABNORMAL HIGH (ref 4.0–10.5)
nRBC: 0 % (ref 0.0–0.2)

## 2020-11-22 MED ORDER — OXYCODONE HCL 5 MG PO TABS
5.0000 mg | ORAL_TABLET | ORAL | Status: DC | PRN
  Administered 2020-11-22 – 2020-11-23 (×5): 10 mg via ORAL
  Filled 2020-11-22 (×5): qty 2

## 2020-11-22 NOTE — Op Note (Signed)
NAMEUCHECHI, DENISON MEDICAL RECORD ON:[id number] ACCOUNT [id number] DATE OF BIRTH:1991/10/23 FACILITY: [facility] LOCATION: [facility] PHYSICIAN:Demiana Crumbley Jamal Collin II, MD  OPERATIVE REPORT  DATE OF PROCEDURE:  11/21/2020  PREOPERATIVE DIAGNOSES: 1.  Fetal intolerance of labor. 2.  Arrest of dilation.  POSTOPERATIVE DIAGNOSES: 1.  Fetal intolerance of labor. 2.  Arrest of dilation.  PROCEDURE:  Low transverse cesarean section.  SURGEON:  Harold Hedge II, MD  ANESTHESIA:  Epidural.  FINDINGS:  Viable female infant.  Apgars, arterial cord pH, and birth weight pending.  SPECIMENS:  Placenta to pathology.  INDICATIONS AND CONSENT:  This patient is a 29 year old G3, P2 at 41 and 0/7 weeks, admitted for induction of labor.  She progresses to 4 cm dilation and complete effacement, -2 station.  Artificial rupture of membranes for clear fluid was carried out.   IUPC was subsequently placed and epidural was placed.  She was noted to have [place] units of 480.  Baby began displaying repetitive late decelerations.  This was treated with phenylephrine once as well as IV fluids, position change and amnioinfusion.   After about four and half hours, there was no cervical change despite adequate labor and continued repetitive late decelerations.  Recommendation for cesarean section was made.  Potential risks and complications were discussed preoperatively including,  but not limited to infection, organ damage, bleeding requiring transfusion of blood products with HIV and hepatitis acquisition, DVT, PE, pneumonia.  The patient states she understands and agrees and consent was signed on the chart.  DESCRIPTION OF PROCEDURE:  The patient was taken to the operating room where she was identified.  Epidural anesthetic was augmented to a surgical level and she was placed in the dorsal supine position with a 15-degree left lateral wedge.  She was prepped  vaginally with Betadine.  Foley catheter was already in  place and she was prepped abdominally with ChloraPrep.  After 3 minute drying time, she was draped in a sterile fashion.  Timeout was undertaken.  After testing for adequate epidural anesthesia,  skin was entered through a Pfannenstiel incision and dissection was carried out in layers to the peritoneum.  Peritoneum was entered and extended superiorly and inferiorly.  Vesicouterine peritoneum was taken down cephalad laterally.  Bladder flap  developed and the bladder blade was placed.  Uterus was incised in a low transverse manner and the uterine cavity was entered bluntly with a hemostat.  Uterine incision was extended cephalad laterally with fingers.  Vertex was then delivered without  difficulty and the baby was delivered without difficulty.  Good cry and tone was noted.  After 1 minute waiting time, the cord was clamped and cut and the baby was handed to waiting pediatrics team.  Placenta was manually delivered.  Uterine cavity was  clean.  The left side of the uterine incision was seen to extend down into the lower uterine segment.  The extension was closed separately with two separate layers and then the uterus was closed in 2 running imbricating layers of 0 Monocryl suture.   Additional sutures of 0 Monocryl were used to imbricate and obtained hemostasis on the lower uterine segment extension.  Multiple figure-of-eights of 2-0 chromic were also used on the incision to obtain complete hemostasis.  In addition, there was a  venous sinus anteriorly on the serosa on the left side.  This was controlled with multiple figure-of-eights pressure and cautery.  After observation, it all was noted to be clear.  Lavage was carried out.  Arista was placed over  these incisions.  Good  hemostasis was noted.  During this process, the patient was given additional IV pain medicine and epidural anesthetic and desiccating 20 mL intraperitoneally to obtain comfort.  The anterior peritoneum was closed in a running fashion  with 0 Monocryl  suture, which was also used to reapproximate the pyramidalis muscle in the midline.  Anterior rectus fascia was closed in a running fashion with a 0 PDS suture.  Subcutaneous layer was closed interrupted with plain suture and the skin was closed in a  subcuticular fashion with 4-0 Vicryl on a Keith needle.  Benzoin, Steri-Strips, honeycomb and pressure dressing were applied.  All counts were correct and the patient was taken to the recovery room in stable condition.  IN/NUANCE  D:11/21/2020 T:11/22/2020 JOB:[id number]

## 2020-11-22 NOTE — Progress Notes (Signed)
Subjective: Postpartum Day 1: Cesarean Delivery Patient reports tolerating PO and no problems voiding.    Objective: Vital signs in last 24 hours: Temp:  [97.5 F (36.4 C)-98.4 F (36.9 C)] 98.1 F (36.7 C) (12/18 0430) Pulse Rate:  [77-115] 85 (12/17 2310) Resp:  [11-33] 17 (12/18 0430) BP: (96-138)/(58-97) 124/71 (12/18 0430) SpO2:  [96 %-100 %] 98 % (12/18 0430)  Physical Exam:  General: alert, cooperative and no distress Lochia: appropriate Uterine Fundus: firm Incision: healing well DVT Evaluation: No evidence of DVT seen on physical exam.  Recent Labs    11/21/20 0052 11/22/20 0407  HGB 10.7* 9.1*  HCT 36.1 29.3*    Assessment/Plan: Status post Cesarean section. Doing well postoperatively.  Continue current care.  Roselle Locus II 11/22/2020, 8:22 AM

## 2020-11-23 MED ORDER — MAGNESIUM HYDROXIDE 400 MG/5ML PO SUSP
30.0000 mL | Freq: Once | ORAL | Status: AC
  Administered 2020-11-23: 18:00:00 30 mL via ORAL
  Filled 2020-11-23 (×2): qty 30

## 2020-11-23 MED ORDER — BISACODYL 10 MG RE SUPP
10.0000 mg | Freq: Once | RECTAL | Status: AC
  Administered 2020-11-23: 09:00:00 10 mg via RECTAL
  Filled 2020-11-23: qty 1

## 2020-11-23 MED ORDER — LIDOCAINE 5 % EX PTCH
1.0000 | MEDICATED_PATCH | CUTANEOUS | Status: DC
  Administered 2020-11-23 – 2020-11-24 (×2): 1 via TRANSDERMAL
  Filled 2020-11-23 (×2): qty 1

## 2020-11-23 NOTE — Progress Notes (Signed)
Subjective: Postpartum Day 2: Cesarean Delivery Patient reports incisional pain, tolerating PO and no problems voiding.   No flatus or BM yet, no N/V, tolerating regular diet Ambulating C/O pulling sensation at left angle of incision with movement Objective: Vital signs in last 24 hours: Temp:  [98.2 F (36.8 C)] 98.2 F (36.8 C) (12/19 0501) Pulse Rate:  [77-97] 97 (12/19 0501) Resp:  [16-19] 16 (12/19 0501) BP: (109-126)/(68-82) 109/73 (12/19 0501) SpO2:  [99 %-100 %] 99 % (12/19 0501)  Physical Exam:  General: alert, cooperative and no distress Lochia: appropriate Uterine Fundus: firm Incision: healing well, pressure dressing in place-C&D DVT Evaluation: No evidence of DVT seen on physical exam.  Recent Labs    11/21/20 0052 11/22/20 0407  HGB 10.7* 9.1*  HCT 36.1 29.3*    Assessment/Plan: Status post Cesarean section. Doing well postoperatively.  Continue current care. Dulcolax suppository Will remove pressure dressing in shower this AM. If incisional pain continues will apply lidocaine patch.  If passes flatus and incisional pain controlled will discharge this afternoon D/W D/C instructions Roselle Locus II 11/23/2020, 7:54 AM

## 2020-11-23 NOTE — Progress Notes (Signed)
Theresa Hull reports pain relief with Lidoderm patch. Had flatus and some stool with Dulcolax suppository.  Today's Vitals   11/23/20 0735 11/23/20 0820 11/23/20 1205 11/23/20 1500  BP:    110/68  Pulse:    78  Resp:    17  Temp:      TempSrc:      SpO2:      Weight:      Height:      PainSc: 7  2  8      Body mass index is 39.58 kg/m.  Abdomen soft, good BS Pressure dressing off. Incision looks good, no induration, no drainage.  A/P: MOM prn. She wanted to try ice cream first (lactose intolerance>BM) D/C in am

## 2020-11-24 MED ORDER — OXYCODONE HCL 5 MG PO TABS: 5.0000 mg | ORAL_TABLET | ORAL | 0 refills | Status: AC | PRN

## 2020-11-24 MED ORDER — IBUPROFEN 600 MG PO TABS: 600.0000 mg | ORAL_TABLET | Freq: Four times a day (QID) | ORAL | 0 refills | Status: AC | PRN

## 2020-11-24 MED ORDER — ACETAMINOPHEN 325 MG PO TABS: 650.0000 mg | ORAL_TABLET | ORAL | 0 refills | Status: AC | PRN

## 2020-11-24 MED ORDER — LIDOCAINE 5 % EX PTCH: 1.0000 | MEDICATED_PATCH | CUTANEOUS | 0 refills | Status: AC

## 2020-11-24 NOTE — Progress Notes (Signed)
Postpartum Progress Note  Postpartum Day 3 s/p primary Cesarean section.  Subjective:  Patient reports no overnight events.  She previously had complaints of left sided tightness and incisional pain, reports good relief with lidocaine patch.  She also previously preported cramping and rectal pressure, but now relieved with flatus.  She has taking MoM, suppository. Has not had a BM yet. Denies nausea, distension, bloating. Otherwise, she reports well controlled pain, ambulating without difficulty, voiding spontaneously, tolerating PO.  Objective: Blood pressure 101/62, pulse 77, temperature 97.9 F (36.6 C), temperature source Oral, resp. rate 17, height 5\' 4"  (1.626 m), weight 104.6 kg, SpO2 98 %, unknown if currently breastfeeding.  Physical Exam:  General: alert and no distress Lochia: appropriate Uterine Fundus: firm Ab: nondistended Incision: dressing in place DVT Evaluation: No evidence of DVT seen on physical exam.  Recent Labs    11/22/20 0407  HGB 9.1*  HCT 29.3*    Assessment/Plan:  Postpartum Day 3, s/p C-section  Lactation following  Patient hopeful for discharge home, doing much better than yesterday. Will monitor bowel function and reassess.    LOS: 3 days   11/24/20 11/24/2020, 7:54 AM

## 2020-11-24 NOTE — Discharge Summary (Signed)
Obstetric Discharge Summary  Trixie Maclaren is a 29 y.o. female that presented on 11/21/2020 for induction of labor. Her labor course was complicated by repetitive late decelerations and failure to progress and she delivered a viable female infant on 11/21/2020 via primary C section.  Her postpartum course was notable for slow return to bowel function.  On 11/24/2020 she reported well controlled pain, spontaneous voiding, ambulating without difficulty, and tolerating PO. Bowel function significantly improved.  She was stable for discharge home on 01/26/2020 with plans for in-office follow up.  Hemoglobin  Date Value Ref Range Status  11/22/2020 9.1 (L) 12.0 - 15.0 g/dL Final   HCT  Date Value Ref Range Status  11/22/2020 29.3 (L) 36.0 - 46.0 % Final    Physical Exam:  General: alert and no distress Lochia: appropriate Uterine Fundus: firm Incision: healing well DVT Evaluation: No evidence of DVT seen on physical exam.  Discharge Diagnoses: Term Pregnancy-delivered  Discharge Information: Date: 11/24/2020 Activity: pelvic rest and as tolerated Diet: routine Medications: tylenol, motrin, oxycodone, lidocaine patch Condition: stable Instructions: refer to practice specific booklet Discharge to: home   Newborn Data: Live born female  Birth Weight: 7 lb 9.3 oz (3439 g) APGAR: 8, 8  Newborn Delivery   Birth date/time: 11/21/2020 14:13:00 Delivery type: C-Section, Low Transverse C-section categorization: Primary      Home with mother.  Lyn Henri 11/24/2020, 8:42 AM

## 2020-11-25 LAB — SURGICAL PATHOLOGY

## 2024-06-04 ENCOUNTER — Other Ambulatory Visit: Payer: Self-pay | Admitting: Obstetrics and Gynecology

## 2024-06-04 DIAGNOSIS — E041 Nontoxic single thyroid nodule: Secondary | ICD-10-CM

## 2024-06-12 ENCOUNTER — Ambulatory Visit
Admission: RE | Admit: 2024-06-12 | Discharge: 2024-06-12 | Disposition: A | Discharge status: Home / Self Care | Source: Ambulatory Visit | Attending: Obstetrics and Gynecology | Admitting: Obstetrics and Gynecology

## 2024-06-12 DIAGNOSIS — E041 Nontoxic single thyroid nodule: Secondary | ICD-10-CM
